# Patient Record
Sex: Female | Born: 2000 | Hispanic: Yes | Marital: Single | Smoking: Never smoker
Health system: Southern US, Community
[De-identification: ages and names within clinical notes are randomized; demographics above are authoritative.]

## PROBLEM LIST (undated history)

## (undated) ENCOUNTER — Inpatient Hospital Stay (HOSPITAL_COMMUNITY): Payer: Self-pay

## (undated) DIAGNOSIS — F32A Depression, unspecified: Secondary | ICD-10-CM

## (undated) DIAGNOSIS — Z3490 Encounter for supervision of normal pregnancy, unspecified, unspecified trimester: Secondary | ICD-10-CM

## (undated) DIAGNOSIS — O139 Gestational [pregnancy-induced] hypertension without significant proteinuria, unspecified trimester: Secondary | ICD-10-CM

## (undated) DIAGNOSIS — E559 Vitamin D deficiency, unspecified: Secondary | ICD-10-CM

## (undated) DIAGNOSIS — Z789 Other specified health status: Secondary | ICD-10-CM

## (undated) DIAGNOSIS — N390 Urinary tract infection, site not specified: Secondary | ICD-10-CM

## (undated) DIAGNOSIS — F419 Anxiety disorder, unspecified: Secondary | ICD-10-CM

## (undated) HISTORY — DX: Depression, unspecified: F32.A

## (undated) HISTORY — PX: WISDOM TOOTH EXTRACTION: SHX21

## (undated) HISTORY — DX: Urinary tract infection, site not specified: N39.0

## (undated) HISTORY — DX: Vitamin D deficiency, unspecified: E55.9

## (undated) HISTORY — PX: NO PAST SURGERIES: SHX2092

## (undated) HISTORY — DX: Gestational (pregnancy-induced) hypertension without significant proteinuria, unspecified trimester: O13.9

## (undated) HISTORY — DX: Anxiety disorder, unspecified: F41.9

---
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
## 2020-01-17 ENCOUNTER — Other Ambulatory Visit: Payer: Self-pay

## 2020-01-17 ENCOUNTER — Emergency Department (HOSPITAL_COMMUNITY)
Admission: EM | Admit: 2020-01-17 | Discharge: 2020-01-17 | Disposition: A | Discharge status: Home / Self Care | Payer: Medicaid Other | Attending: Emergency Medicine | Admitting: Emergency Medicine

## 2020-01-17 ENCOUNTER — Encounter (HOSPITAL_COMMUNITY): Payer: Self-pay

## 2020-01-17 DIAGNOSIS — J029 Acute pharyngitis, unspecified: Secondary | ICD-10-CM | POA: Diagnosis not present

## 2020-01-17 DIAGNOSIS — Z5321 Procedure and treatment not carried out due to patient leaving prior to being seen by health care provider: Secondary | ICD-10-CM | POA: Diagnosis not present

## 2020-01-17 LAB — GROUP A STREP BY PCR: Group A Strep by PCR: NOT DETECTED

## 2020-01-17 NOTE — ED Triage Notes (Signed)
Patient c/o sore throat x 1 week. patient states she noticed white patches today. Patient denies any fever.

## 2021-07-26 ENCOUNTER — Emergency Department (HOSPITAL_BASED_OUTPATIENT_CLINIC_OR_DEPARTMENT_OTHER)
Admission: EM | Admit: 2021-07-26 | Discharge: 2021-07-27 | Disposition: A | Discharge status: Home / Self Care | Payer: Medicaid Other | Attending: Emergency Medicine | Admitting: Emergency Medicine

## 2021-07-26 ENCOUNTER — Encounter (HOSPITAL_BASED_OUTPATIENT_CLINIC_OR_DEPARTMENT_OTHER): Payer: Self-pay

## 2021-07-26 ENCOUNTER — Other Ambulatory Visit: Payer: Self-pay

## 2021-07-26 DIAGNOSIS — S00451A Superficial foreign body of right ear, initial encounter: Secondary | ICD-10-CM | POA: Diagnosis present

## 2021-07-26 DIAGNOSIS — W4904XA Ring or other jewelry causing external constriction, initial encounter: Secondary | ICD-10-CM | POA: Diagnosis not present

## 2021-07-26 NOTE — ED Triage Notes (Signed)
Pt reports ?back of earring lodged in left upper ear-NAD-steady gait

## 2021-07-26 NOTE — ED Provider Notes (Signed)
[facility] Provider Note: Lowella Dell, MD, FACEP  CSN: [id number] MRN: [id number] ARRIVAL: 07/26/21 at 2256 ROOM: [facility]02/[facility]02   CHIEF COMPLAINT  Foreign Body in Ear   HISTORY OF PRESENT ILLNESS  07/26/21 11:57 PM Heather Ingram is a 20 y.o. female who has had a piercing of her right helix for the past year.  Over the past 2 days the site has become painful and erythematous.  She believes the backing of the earring has been withdrawn into the tissue of the ear as she cannot see it or palpated externally.  She rates associated pain as moderate when palpating but minimal at rest.  There has been no purulent drainage.   History reviewed. No pertinent past medical history.  History reviewed. No pertinent surgical history.  Family History  Problem Relation Age of Onset   Diabetes Maternal Aunt    Healthy Mother    Healthy Father     Social History   Tobacco Use   Smoking status: Never   Smokeless tobacco: Never  Vaping Use   Vaping Use: Former   Substances: Nicotine  Substance Use Topics   Alcohol use: Never   Drug use: Never    Prior to Admission medications   Medication Sig Start Date End Date Taking? Authorizing Provider  doxycycline (VIBRAMYCIN) 100 MG capsule Take 1 capsule (100 mg total) by mouth 2 (two) times daily. One po bid x 7 days 07/27/21  Yes Demetric Parslow, MD    Allergies Patient has no known allergies.   REVIEW OF SYSTEMS  Negative except as noted here or in the History of Present Illness.   PHYSICAL EXAMINATION  Initial Vital Signs Blood pressure 132/85, pulse (!) 101, temperature 98.4 F (36.9 C), temperature source Oral, resp. rate 18, height 5\' 2"  (1.575 m), weight (!) 137 kg, last menstrual period 07/25/2021, SpO2 97 %.  Examination General: Well-developed, well-nourished female in no acute distress; appearance consistent with age of record HENT: normocephalic; atraumatic; piercing of right helix with erythema and  withdrawal of the earring back into the tissue of the ear:      Eyes: pupils equal, round and reactive to light; extraocular muscles intact Neck: supple Heart: regular rate and rhythm Lungs: clear to auscultation bilaterally Abdomen: soft; nondistended; nontender; bowel sounds present Extremities: No deformity; full range of motion Neurologic: Awake, alert and oriented; motor function intact in all extremities and symmetric; no facial droop Skin: Warm and dry Psychiatric: Normal mood and affect   RESULTS  Summary of this visit's results, reviewed and interpreted by myself:   EKG Interpretation  Date/Time:    Ventricular Rate:    PR Interval:    QRS Duration:   QT Interval:    QTC Calculation:   R Axis:     Text Interpretation:         Laboratory Studies: No results found for this or any previous visit (from the past 24 hour(s)). Imaging Studies: No results found.  ED COURSE and MDM  Nursing notes, initial and subsequent vitals signs, including pulse oximetry, reviewed and interpreted by myself.  Vitals:   07/26/21 2303 07/26/21 2305  BP: 132/85   Pulse: (!) 101   Resp: 18   Temp: 98.4 F (36.9 C)   TempSrc: Oral   SpO2: 97%   Weight:  (!) 137 kg  Height:  5\' 2"  (1.575 m)   Medications  doxycycline (VIBRA-TABS) tablet 100 mg (has no administration in time range)  lidocaine (PF) (XYLOCAINE) 1 %  injection 5 mL (3 mLs Intradermal Given 07/27/21 0000)    Will place patient on doxycycline for possible bacterial infection.  The erythema and tenderness may also be a foreign body reaction which should improve status post removal.  PROCEDURES  .Foreign Body Removal  Date/Time: 07/27/2021 12:23 AM Performed by: Salayah Meares, MD Authorized by: Avonell Lenig, MD  Consent: Verbal consent obtained. Risks and benefits: risks, benefits and alternatives were discussed Consent given by: patient Patient understanding: patient states understanding of the procedure being  performed Patient identity confirmed: verbally with patient Time out: Immediately prior to procedure a "time out" was called to verify the correct patient, procedure, equipment, support staff and site/side marked as required. Body area: ear Location details: right ear Anesthesia: local infiltration  Anesthesia: Local Anesthetic: lidocaine 1% without epinephrine Anesthetic total: 1 mL  Sedation: Patient sedated: no  Patient restrained: no Patient cooperative: yes Localization method: incision with #11 blade. Removal mechanism: unscrewing of post from back using forceps. Complexity: simple 2 objects recovered. Objects recovered: Earring post and back Post-procedure assessment: foreign body removed Patient tolerance: patient tolerated the procedure well with no immediate complications  ED DIAGNOSES     ICD-10-CM   1. Embedded earring of right ear, initial encounter  S00.451A          Brigette Hopfer, Jonny Ruiz, MD 07/27/21 0028

## 2021-07-27 MED ORDER — LIDOCAINE HCL (PF) 1 % IJ SOLN
INTRAMUSCULAR | Status: AC
  Administered 2021-07-27: 3 mL via INTRADERMAL
  Filled 2021-07-27: qty 5

## 2021-07-27 MED ORDER — DOXYCYCLINE HYCLATE 100 MG PO TABS
100.0000 mg | ORAL_TABLET | Freq: Once | ORAL | Status: AC
  Administered 2021-07-27: 100 mg via ORAL
  Filled 2021-07-27: qty 1

## 2021-07-27 MED ORDER — LIDOCAINE HCL (PF) 1 % IJ SOLN: 5.0000 mL | Freq: Once | INTRAMUSCULAR | Status: AC

## 2021-07-27 MED ORDER — DOXYCYCLINE HYCLATE 100 MG PO CAPS
100.0000 mg | ORAL_CAPSULE | Freq: Two times a day (BID) | ORAL | 0 refills | Status: DC
Start: 2021-07-27 — End: 2021-12-15

## 2021-08-23 ENCOUNTER — Encounter (HOSPITAL_BASED_OUTPATIENT_CLINIC_OR_DEPARTMENT_OTHER): Payer: Self-pay

## 2021-08-23 ENCOUNTER — Emergency Department (HOSPITAL_BASED_OUTPATIENT_CLINIC_OR_DEPARTMENT_OTHER)
Admission: EM | Admit: 2021-08-23 | Discharge: 2021-08-23 | Disposition: A | Discharge status: Home / Self Care | Payer: Medicaid Other | Attending: Emergency Medicine | Admitting: Emergency Medicine

## 2021-08-23 ENCOUNTER — Other Ambulatory Visit: Payer: Self-pay

## 2021-08-23 ENCOUNTER — Emergency Department (HOSPITAL_BASED_OUTPATIENT_CLINIC_OR_DEPARTMENT_OTHER): Payer: Medicaid Other

## 2021-08-23 DIAGNOSIS — J111 Influenza due to unidentified influenza virus with other respiratory manifestations: Secondary | ICD-10-CM

## 2021-08-23 DIAGNOSIS — Z20822 Contact with and (suspected) exposure to covid-19: Secondary | ICD-10-CM | POA: Insufficient documentation

## 2021-08-23 DIAGNOSIS — J101 Influenza due to other identified influenza virus with other respiratory manifestations: Secondary | ICD-10-CM | POA: Diagnosis not present

## 2021-08-23 DIAGNOSIS — R509 Fever, unspecified: Secondary | ICD-10-CM | POA: Diagnosis present

## 2021-08-23 LAB — RESP PANEL BY RT-PCR (FLU A&B, COVID) ARPGX2
Influenza A by PCR: POSITIVE — AB
Influenza B by PCR: NEGATIVE
SARS Coronavirus 2 by RT PCR: NEGATIVE

## 2021-08-23 NOTE — ED Provider Notes (Signed)
[facility] EMERGENCY DEPARTMENT Provider Note   CSN: [id number] Arrival date & time: 08/23/21  0759     History Chief Complaint  Patient presents with   Fever   Cough   Nasal Congestion    Heather Ingram is a 20 y.o. female.  20 year old female who presents with complaints of fever up to 101/102 yesterday, cough, congestion, sore throat and body aches for the past 2 days.  She has been taking over-the-counter medication naproxen with some relief of symptoms.  She has had mild nausea with one episode of emesis but has not thrown up since.  Denies any diarrhea.  She is able to tolerate p.o. intake.  She has had 3 COVID shots but has not yet had her annual flu vaccination.  She is not aware of any sick contacts.  The history is provided by the patient.  Fever Associated symptoms: congestion, cough and sore throat   Cough Associated symptoms: fever and sore throat       History reviewed. No pertinent past medical history.  There are no problems to display for this patient.   History reviewed. No pertinent surgical history.   OB History   No obstetric history on file.     Family History  Problem Relation Age of Onset   Diabetes Maternal Aunt    Healthy Mother    Healthy Father     Social History   Tobacco Use   Smoking status: Never   Smokeless tobacco: Never  Vaping Use   Vaping Use: Former   Substances: Nicotine  Substance Use Topics   Alcohol use: Never   Drug use: Never    Home Medications Prior to Admission medications   Medication Sig Start Date End Date Taking? Authorizing Provider  doxycycline (VIBRAMYCIN) 100 MG capsule Take 1 capsule (100 mg total) by mouth 2 (two) times daily. One po bid x 7 days 07/27/21   Molpus, Jonny Ruiz, MD    Allergies    Patient has no known allergies.  Review of Systems   Review of Systems  Constitutional:  Positive for fever.  HENT:  Positive for congestion and sore throat.   Respiratory:  Positive  for cough.   Cardiovascular: Negative.   Gastrointestinal: Negative.   Endocrine: Negative.   Genitourinary: Negative.   Musculoskeletal: Negative.   Skin: Negative.   Allergic/Immunologic: Negative.   Neurological: Negative.   Hematological: Negative.   Psychiatric/Behavioral: Negative.     Physical Exam Updated Vital Signs BP 115/72 (BP Location: Right Arm)   Pulse (!) 109   Temp 99.1 F (37.3 C) (Oral)   Resp 20   LMP 07/25/2021   SpO2 96%   Physical Exam Constitutional:      General: She is not in acute distress.    Appearance: Normal appearance. She is obese. She is not ill-appearing, toxic-appearing or diaphoretic.  HENT:     Head: Normocephalic and atraumatic.  Cardiovascular:     Rate and Rhythm: Normal rate and regular rhythm.     Pulses: Normal pulses.     Heart sounds: Normal heart sounds.  Pulmonary:     Effort: Pulmonary effort is normal.     Breath sounds: Normal breath sounds.  Abdominal:     General: Abdomen is flat. Bowel sounds are normal.     Palpations: Abdomen is soft.  Musculoskeletal:        General: Normal range of motion.  Skin:    General: Skin is warm and dry.  Neurological:  General: No focal deficit present.     Mental Status: She is alert and oriented to person, place, and time. Mental status is at baseline.  Psychiatric:        Mood and Affect: Mood normal.        Behavior: Behavior normal.        Thought Content: Thought content normal.        Judgment: Judgment normal.    ED Results / Procedures / Treatments   Labs (all labs ordered are listed, but only abnormal results are displayed) Labs Reviewed  RESP PANEL BY RT-PCR (FLU A&B, COVID) [id number]    EKG None  Radiology No results found.  Procedures Procedures   Medications Ordered in ED Medications - No data to display  ED Course  I have reviewed the triage vital signs and the nursing notes.  Pertinent labs & imaging results that were available during my care  of the patient were reviewed by me and considered in my medical decision making (see chart for details).    MDM Rules/Calculators/A&P                           20 year old female who presented with complaints of cough shortness of breath body aches sore throat.  She had received 3 COVID shots had not yet received this years flu shot.  During evaluation in the emergency department she was awake alert and oriented vital signs were stable and she was saturating well on room air 97% chest x-ray did not show any concerning findings.  Respiratory panel was positive for influenza A. Patient was outside window for appropriate administration of Tamiflu.  She is able to tolerate p.o. intake and was instructed to continue supportive care at home.  Strict return precautions were discussed with her.  Final Clinical Impression(s) / ED Diagnoses Final diagnoses:  None    Rx / DC Orders ED Discharge Orders     None        Adron Bene, MD 08/24/21 0900    Tegeler, Canary Brim, MD 08/24/21 [id number]

## 2021-08-23 NOTE — ED Triage Notes (Signed)
Pt c/o fever, cough, congestion, sore throat, and bodyaches x 2 days. Pain score 4/10.  Pt reports taking OTC medication w/ some relief.

## 2021-08-23 NOTE — Discharge Instructions (Addendum)
Dear Ms. Heather Ingram, Today we evaluated you for your cough sore throat body aches.  We tested you for COVID and flu.  COVID test was negative.  Your flu test was positive.  We recommend symptomatic and supportive treatment.  This includes making sure you are drinking adequate fluids.  Tamiflu is only effective within the first 48 hours so you are not likely to receive much benefit from this medication.  If your symptoms do not improve or if they worsen I recommend you return to the emergency department.

## 2021-10-21 NOTE — L&D Delivery Note (Signed)
Delivery Note At 5:24 PM a viable female was delivered via Vaginal, Spontaneous (Presentation: Left Occiput Anterior).  APGAR: 8, 9; weight  pending.   Placenta status: Spontaneous, Intact.  Cord: 3 vessels with the following complications: None.  Cord pH: n/a  Anesthesia: Epidural Episiotomy: None Lacerations: 1st degree;Vaginal Suture Repair: 2.0 3.0 vicryl Est. Blood Loss (mL): 234  Mom to postpartum.  Baby to Couplet care / Skin to Skin.  Delice Lesch 07/24/2022, 5:49 PM

## 2021-12-13 ENCOUNTER — Telehealth: Payer: Self-pay | Admitting: *Deleted

## 2021-12-13 NOTE — Telephone Encounter (Signed)
TC from pt regarding bleeding in early pregnancy. Pt reports approx [redacted] wks pregnant. Has not been seen in the office yet. Advised to seek care in [facility] for vaginal bleeding and to rule out SAB and ectopic pregnancy.

## 2021-12-14 ENCOUNTER — Inpatient Hospital Stay (HOSPITAL_COMMUNITY): Payer: Medicaid Other

## 2021-12-14 ENCOUNTER — Inpatient Hospital Stay (HOSPITAL_COMMUNITY)
Admission: AD | Admit: 2021-12-14 | Discharge: 2021-12-15 | Disposition: A | Discharge status: Home / Self Care | Payer: Medicaid Other | Attending: Obstetrics and Gynecology | Admitting: Obstetrics and Gynecology

## 2021-12-14 ENCOUNTER — Encounter (HOSPITAL_COMMUNITY): Payer: Self-pay | Admitting: Obstetrics and Gynecology

## 2021-12-14 ENCOUNTER — Other Ambulatory Visit: Payer: Self-pay

## 2021-12-14 DIAGNOSIS — Z3A01 Less than 8 weeks gestation of pregnancy: Secondary | ICD-10-CM | POA: Insufficient documentation

## 2021-12-14 DIAGNOSIS — F32A Depression, unspecified: Secondary | ICD-10-CM | POA: Insufficient documentation

## 2021-12-14 DIAGNOSIS — O99341 Other mental disorders complicating pregnancy, first trimester: Secondary | ICD-10-CM | POA: Insufficient documentation

## 2021-12-14 DIAGNOSIS — O26891 Other specified pregnancy related conditions, first trimester: Secondary | ICD-10-CM | POA: Diagnosis not present

## 2021-12-14 DIAGNOSIS — R079 Chest pain, unspecified: Secondary | ICD-10-CM | POA: Diagnosis not present

## 2021-12-14 DIAGNOSIS — O209 Hemorrhage in early pregnancy, unspecified: Secondary | ICD-10-CM | POA: Insufficient documentation

## 2021-12-14 DIAGNOSIS — R109 Unspecified abdominal pain: Secondary | ICD-10-CM | POA: Diagnosis not present

## 2021-12-14 DIAGNOSIS — F419 Anxiety disorder, unspecified: Secondary | ICD-10-CM | POA: Insufficient documentation

## 2021-12-14 HISTORY — DX: Other specified health status: Z78.9

## 2021-12-14 LAB — COMPREHENSIVE METABOLIC PANEL
ALT: 26 U/L (ref 0–44)
AST: 22 U/L (ref 15–41)
Albumin: 3.4 g/dL — ABNORMAL LOW (ref 3.5–5.0)
Alkaline Phosphatase: 44 U/L (ref 38–126)
Anion gap: 9 (ref 5–15)
BUN: 9 mg/dL (ref 6–20)
CO2: 22 mmol/L (ref 22–32)
Calcium: 9 mg/dL (ref 8.9–10.3)
Chloride: 102 mmol/L (ref 98–111)
Creatinine, Ser: 0.69 mg/dL (ref 0.44–1.00)
GFR, Estimated: >60 mL/min (ref >60)
Glucose, Bld: 117 mg/dL — ABNORMAL HIGH (ref 70–99)
Potassium: 3.9 mmol/L (ref 3.5–5.1)
Sodium: 133 mmol/L — ABNORMAL LOW (ref 135–145)
Total Bilirubin: 0.2 mg/dL — ABNORMAL LOW (ref 0.3–1.2)
Total Protein: 6.6 g/dL (ref 6.5–8.1)

## 2021-12-14 LAB — CBC
HCT: 35.9 % — ABNORMAL LOW (ref 36.0–46.0)
Hemoglobin: 12.2 g/dL (ref 12.0–15.0)
MCH: 28.1 pg (ref 26.0–34.0)
MCHC: 34 g/dL (ref 30.0–36.0)
MCV: 82.7 fL (ref 80.0–100.0)
Platelets: 301 10*3/uL (ref 150–400)
RBC: 4.34 MIL/uL (ref 3.87–5.11)
RDW: 12.5 % (ref 11.5–15.5)
WBC: 10.3 10*3/uL (ref 4.0–10.5)
nRBC: 0 % (ref 0.0–0.2)

## 2021-12-14 LAB — TROPONIN I (HIGH SENSITIVITY): Troponin I (High Sensitivity): <2 ng/L (ref <18)

## 2021-12-14 LAB — WET PREP, GENITAL
Clue Cells Wet Prep HPF POC: NONE SEEN
Sperm: NONE SEEN
Trich, Wet Prep: NONE SEEN
WBC, Wet Prep HPF POC: >=10 — AB (ref <10)
Yeast Wet Prep HPF POC: NONE SEEN

## 2021-12-14 LAB — POCT PREGNANCY, URINE: Preg Test, Ur: POSITIVE — AB

## 2021-12-14 LAB — ABO/RH: ABO/RH(D): B POS

## 2021-12-14 LAB — HCG, QUANTITATIVE, PREGNANCY: hCG, Beta Chain, Quant, S: 76603 m[IU]/mL — ABNORMAL HIGH (ref <5)

## 2021-12-14 LAB — BRAIN NATRIURETIC PEPTIDE: B Natriuretic Peptide: 16.2 pg/mL (ref 0.0–100.0)

## 2021-12-14 NOTE — MAU Note (Signed)
Pt reports to [facility]U with c/o vaginal bleeding that started on Tuesday only has been blood show on tissue.  Was advised to come in since it had been over several days.  No abnormal discharge.  Pt reports that she is having chest pain that started today.  Pt states that it is a sharp shooting pain that last for about an hour today.  Pt states that she was provided with an inhaler in January but has not used it.  Pt reports LMP 10/25/2021.

## 2021-12-14 NOTE — MAU Provider Note (Signed)
Chief Complaint: Vaginal Bleeding   Event Date/Time   First Provider Initiated Contact with Patient 12/14/21 2236      SUBJECTIVE HPI: Heather Ingram is a 21 y.o. G1P0 at [redacted]w[redacted]d by sure LMP who presents to maternity admissions reporting onset of bleeding 3 days ago, that was heavy enough to require a pantyliner when it started and light red but is now brown and spotting only. There is mild abdominal cramping. Today, she had a sharp chest pain that she often has when she is on her period so she wanted to get checked out. The chest pain is now dull/pressure and mild but is still present. There is no SOB.     HPI  Past Medical History:  Diagnosis Date   Medical history non-contributory    Past Surgical History:  Procedure Laterality Date   NO PAST SURGERIES     Social History   Socioeconomic History   Marital status: Single    Spouse name: Not on file   Number of children: Not on file   Years of education: Not on file   Highest education level: Not on file  Occupational History   Not on file  Tobacco Use   Smoking status: Never   Smokeless tobacco: Never  Vaping Use   Vaping Use: Former   Substances: Nicotine  Substance and Sexual Activity   Alcohol use: Never   Drug use: Never   Sexual activity: Yes    Birth control/protection: None  Other Topics Concern   Not on file  Social History Narrative   Not on file   Social Determinants of Health   Financial Resource Strain: Not on file  Food Insecurity: Not on file  Transportation Needs: Not on file  Physical Activity: Not on file  Stress: Not on file  Social Connections: Not on file  Intimate Partner Violence: Not on file   No current facility-administered medications on file prior to encounter.   Current Outpatient Medications on File Prior to Encounter  Medication Sig Dispense Refill   buPROPion (WELLBUTRIN XL) 150 MG 24 hr tablet Take 150 mg by mouth daily.     No Known Allergies  ROS:  Review of  Systems  Constitutional:  Negative for chills, fatigue and fever.  Respiratory:  Negative for shortness of breath.   Cardiovascular:  Positive for chest pain.  Gastrointestinal:  Positive for abdominal pain. Negative for constipation, diarrhea and vomiting.  Genitourinary:  Positive for vaginal bleeding. Negative for difficulty urinating, dysuria, flank pain, pelvic pain, vaginal discharge and vaginal pain.  Neurological:  Negative for dizziness and headaches.  Psychiatric/Behavioral: Negative.      I have reviewed patient's Past Medical Hx, Surgical Hx, Family Hx, Social Hx, medications and allergies.   Physical Exam  Patient Vitals for the past 24 hrs:  BP Temp Temp src Pulse Resp SpO2 Weight  12/15/21 0009 138/82 -- -- 89 18 -- --  12/14/21 2222 117/80 -- -- 97 16 100 % --  12/14/21 2209 (!) 119/51 98.1 F (36.7 C) Oral (!) 105 16 99 % (!) 140.4 kg   Constitutional: Well-developed, well-nourished female in no acute distress.  HEART: normal rate, heart sounds, regular rhythm RESP: normal effort, lung sounds clear and equal bilaterally  GI: Abd soft, non-tender. Pos BS x 4 MS: Extremities nontender, no edema, normal ROM Neurologic: Alert and oriented x 4.  GU: Neg CVAT.  PELVIC EXAM: Self swab performed by pt for vaginal cultures    LAB RESULTS Results for  orders placed or performed during the hospital encounter of 12/14/21 (from the past 24 hour(s))  Pregnancy, urine POC     Status: Abnormal   Collection Time: 12/14/21 10:05 PM  Result Value Ref Range   Preg Test, Ur POSITIVE (A) NEGATIVE  Wet prep, genital     Status: Abnormal   Collection Time: 12/14/21 10:40 PM  Result Value Ref Range   Yeast Wet Prep HPF POC NONE SEEN NONE SEEN   Trich, Wet Prep NONE SEEN NONE SEEN   Clue Cells Wet Prep HPF POC NONE SEEN NONE SEEN   WBC, Wet Prep HPF POC >=10 (A) <10   Sperm NONE SEEN   CBC     Status: Abnormal   Collection Time: 12/14/21 10:53 PM  Result Value Ref Range   WBC  10.3 4.0 - 10.5 K/uL   RBC 4.34 3.87 - 5.11 MIL/uL   Hemoglobin 12.2 12.0 - 15.0 g/dL   HCT 37.9 (L) 02.4 - 09.7 %   MCV 82.7 80.0 - 100.0 fL   MCH 28.1 26.0 - 34.0 pg   MCHC 34.0 30.0 - 36.0 g/dL   RDW 35.3 29.9 - 24.2 %   Platelets 301 150 - 400 K/uL   nRBC 0.0 0.0 - 0.2 %  hCG, quantitative, pregnancy     Status: Abnormal   Collection Time: 12/14/21 10:53 PM  Result Value Ref Range   hCG, Beta Chain, Quant, S 76,603 (H) <5 mIU/mL  ABO/Rh     Status: None   Collection Time: 12/14/21 10:53 PM  Result Value Ref Range   ABO/RH(D) B POS    No rh immune globuloin      NOT A RH IMMUNE GLOBULIN CANDIDATE, PT RH POSITIVE Performed at [facility], [address], [place], [place] [address]   Comprehensive metabolic panel     Status: Abnormal   Collection Time: 12/14/21 10:53 PM  Result Value Ref Range   Sodium 133 (L) 135 - 145 mmol/L   Potassium 3.9 3.5 - 5.1 mmol/L   Chloride 102 98 - 111 mmol/L   CO2 22 22 - 32 mmol/L   Glucose, Bld 117 (H) 70 - 99 mg/dL   BUN 9 6 - 20 mg/dL   Creatinine, Ser 9.62 0.44 - 1.00 mg/dL   Calcium 9.0 8.9 - 22.9 mg/dL   Total Protein 6.6 6.5 - 8.1 g/dL   Albumin 3.4 (L) 3.5 - 5.0 g/dL   AST 22 15 - 41 U/L   ALT 26 0 - 44 U/L   Alkaline Phosphatase 44 38 - 126 U/L   Total Bilirubin 0.2 (L) 0.3 - 1.2 mg/dL   GFR, Estimated >79 >89 mL/min   Anion gap 9 5 - 15  Troponin I (High Sensitivity)     Status: None   Collection Time: 12/14/21 10:53 PM  Result Value Ref Range   Troponin I (High Sensitivity) <2 <18 ng/L  Brain natriuretic peptide     Status: None   Collection Time: 12/14/21 10:53 PM  Result Value Ref Range   B Natriuretic Peptide 16.2 0.0 - 100.0 pg/mL    --/--/B POS (02/24 2253)  EKG: normal EKG, normal sinus rhythm.  IMAGING US OB LESS THAN 14 WEEKS WITH OB TRANSVAGINAL  Result Date: 12/14/2021 CLINICAL DATA:  Bleeding EXAM: OBSTETRIC <14 WK [place] AND TRANSVAGINAL OB US TECHNIQUE: Both transabdominal and transvaginal  ultrasound examinations were performed for complete evaluation of the gestation as well as the maternal uterus, adnexal regions, and pelvic  cul-de-sac. Transvaginal technique was performed to assess early pregnancy. COMPARISON:  None. FINDINGS: Intrauterine gestational sac: Single Yolk sac:  Visualized. Embryo:  Visualized. Cardiac Activity: Visualized. Heart Rate: 135 bpm MSD:   mm    w     d CRL:  11.6 mm   7 w   2 d                  [place] EDC: 07/31/2022 Subchorionic hemorrhage:  None visualized. Maternal uterus/adnexae: No adnexal mass or free fluid. IMPRESSION: Seven week 2 day intrauterine pregnancy. Fetal heart rate 135 beats per minute. No acute maternal findings. Electronically Signed   By: Charlett Nose M.D.   On: 12/14/2021 23:30    MAU Management/MDM: Orders Placed This Encounter  Procedures   Wet prep, genital   US OB LESS THAN 14 WEEKS WITH OB TRANSVAGINAL   CBC   hCG, quantitative, pregnancy   Comprehensive metabolic panel   Brain natriuretic peptide   Pregnancy, urine POC   ED EKG   ABO/Rh   Discharge patient    Meds ordered this encounter  Medications   hydrOXYzine (VISTARIL) 25 MG capsule    Sig: Take 1 capsule (25 mg total) by mouth 3 (three) times daily as needed.    Dispense:  30 capsule    Refill:  5    Order Specific Question:   Supervising Provider    Answer:   Alysia Penna, MICHAEL L [[id number]]    Normal Heart/lung sounds, normal heartrate, cardiac labs, and EKG normal, so no cardiopulmonary causes for chest pain found.  Pt has hx of this pain with menses, so possible referred pain. Pt also recently dx and started treatment for anxiety/depression so this could contribute to chest tightness/pain.  Warning signs/reasons to seek care reviewed.    IUP with normal fetal heartrate noted on [place] today.  Discussed results with pt. No causes for bleeding found. GCC pending.    Reviewed pt at home meds.  If pt can do trial without Vyvanse, I recommend this but can resume and discuss at her  prenatal visits if she does not do well.  I discussed Wellbutrin and Trazodone as medications that are acceptable but with more recommended alternatives in pregnancy.  Pt just started Wellbutrin 1 month ago.  Benefits of continuing medication likely outweigh any small risks.  Rx for Vistaril today for anxiety, pt to limit Trazodone use.  F/U at [facility] as scheduled, return to MAU as needed for emergencies.    ASSESSMENT 1. [redacted] weeks gestation of pregnancy   2. Vaginal bleeding in pregnancy, first trimester   3. Anxiety during pregnancy, antepartum, first trimester     PLAN Discharge home Allergies as of 12/15/2021   No Known Allergies      Medication List     STOP taking these medications    doxycycline 100 MG capsule Commonly known as: VIBRAMYCIN   lisdexamfetamine 30 MG capsule Commonly known as: VYVANSE   traZODone 50 MG tablet Commonly known as: DESYREL       TAKE these medications    buPROPion 150 MG 24 hr tablet Commonly known as: WELLBUTRIN XL Take 150 mg by mouth daily.   hydrOXYzine 25 MG capsule Commonly known as: Vistaril Take 1 capsule (25 mg total) by mouth 3 (three) times daily as needed.        Follow-up Information     [facility] Follow up.   Why: As scheduled Contact information: [address]  [place] [phone]        [facility] Follow up.   Specialty: Obstetrics and Gynecology Why: As needed for emergencies Contact information: [address] [id number] [place] [phone]                Sharen CounterLisa Leftwich-Kirby Certified Nurse-Midwife 12/15/2021  12:17 AM

## 2021-12-15 MED ORDER — HYDROXYZINE PAMOATE 25 MG PO CAPS: 25.0000 mg | ORAL_CAPSULE | Freq: Three times a day (TID) | ORAL | 5 refills | Status: DC | PRN

## 2021-12-15 NOTE — MAU Note (Signed)
Patient signed physical copy of AVS due to E-signature malfunction in room.  

## 2021-12-17 LAB — GC/CHLAMYDIA PROBE AMP (~~LOC~~) NOT AT ARMC
Chlamydia: NEGATIVE
Comment: NEGATIVE
Comment: NORMAL
Neisseria Gonorrhea: NEGATIVE

## 2021-12-28 ENCOUNTER — Ambulatory Visit (INDEPENDENT_AMBULATORY_CARE_PROVIDER_SITE_OTHER): Payer: Medicaid Other

## 2021-12-28 VITALS — Ht 63.0 in

## 2021-12-28 DIAGNOSIS — Z3400 Encounter for supervision of normal first pregnancy, unspecified trimester: Secondary | ICD-10-CM | POA: Insufficient documentation

## 2021-12-28 DIAGNOSIS — O2341 Unspecified infection of urinary tract in pregnancy, first trimester: Secondary | ICD-10-CM

## 2021-12-28 HISTORY — DX: Encounter for supervision of normal first pregnancy, unspecified trimester: Z34.00

## 2021-12-28 MED ORDER — BLOOD PRESSURE KIT DEVI: 1.0000 | 0 refills | Status: DC | PRN

## 2021-12-28 MED ORDER — GOJJI WEIGHT SCALE MISC: 1.0000 | 0 refills | Status: DC | PRN

## 2021-12-28 NOTE — Progress Notes (Addendum)
..  New OB Intake ? ?I connected with  Heather Ingram on 12/28/21 at  9:00 AM EST by telephone Video Visit and verified that I am speaking with the correct person using two identifiers. Nurse is located at [facility] and pt is located at home. ? ?I discussed the limitations, risks, security and privacy concerns of performing an evaluation and management service by telephone and the availability of in person appointments. I also discussed with the patient that there may be a patient responsible charge related to this service. The patient expressed understanding and agreed to proceed. ? ?I explained I am completing New OB Intake today. We discussed her EDD of 08/01/22 that is based on LMP of 10/25/21. Pt is G1/P0. I reviewed her allergies, medications, Medical/Surgical/OB history, and appropriate screenings. I informed her of [facility] services. Based on history, this is a/an  pregnancy uncomplicated .  ? ?Pt is reporting discomfort and odor with urination. Advised pt to come to office and leave urine sample for testing today, pt agreed. ?Pt scored 17 on GAD7 and 11 on PHQ9, referral placed ? ?Concerns addressed today ? ?Delivery Plans:  ?Plans to deliver at [facility].  ? ?Waterbirth candidate?  ? ?MyChart/Babyscripts ?MyChart access verified. I explained pt will have some visits in office and some virtually. Babyscripts instructions given and order placed. Patient verifies receipt of registration text/e-mail. Account successfully created and app downloaded. ? ?Blood Pressure Cuff  ?Blood pressure cuff ordered for patient to pick-up from [employer]. Explained after first prenatal appt pt will check weekly and document in Babyscripts. ? ?Weight scale: Patient does / does not  have weight scale. Weight scale ordered for patient to pick up from [employer].  ? ?Anatomy US ?Explained first scheduled [place] will be around 19 weeks. Anatomy US pending to be scheduled. ?Scheduled AFP lab only appointment if  CenteringPregnancy pt for same day as anatomy US.  ? ?Labs ?Discussed Avelina Laine genetic screening with patient. Would like both Panorama and Horizon drawn at new OB visit.Also if interested in genetic testing, tell patient she will need AFP 15-21 weeks to complete genetic testing .Routine prenatal labs needed. ? ?Covid Vaccine ?Patient has covid vaccine.  ? ?Is patient a CenteringPregnancy candidate? Not a candidate  ? ?Is patient a Mom+Baby Combined Care candidate? Not a candidate    ? ?Informed patient of [facility] website  and placed link in her AVS.  ? ?Social Determinants of Health ?Food Insecurity: Patient denies food insecurity. ?WIC Referral: Patient is not interested in referral to [facility].  ?Transportation: Patient denies transportation needs. ?Childcare: Discussed no children allowed at ultrasound appointments. Offered childcare services; patient declines childcare services at this time. ? ?Send link to Pregnancy Navigators ? ? ?Placed OB Box on problem list and updated ? ?First visit review ?I reviewed new OB appt with pt. I explained she will have a pelvic exam, ob bloodwork with genetic screening, and PAP smear. Explained pt will be seen by [facility] at first visit; encounter routed to appropriate provider. Explained that patient will be seen by pregnancy navigator following visit with provider. [facility] information placed in AVS.  ? ?Katrina Stack, RN ?12/28/2021  10:35 AM  ? ?Patient was assessed and managed by nursing staff during this encounter. I have reviewed the chart and agree with the documentation and plan. I have also made any necessary editorial changes. ? ?Jaynie Collins, MD ?12/28/2021 11:30 AM  ? ?

## 2021-12-31 ENCOUNTER — Encounter: Payer: Self-pay | Admitting: Obstetrics & Gynecology

## 2021-12-31 DIAGNOSIS — O2341 Unspecified infection of urinary tract in pregnancy, first trimester: Secondary | ICD-10-CM | POA: Insufficient documentation

## 2021-12-31 LAB — URINE CULTURE, OB REFLEX

## 2021-12-31 LAB — CULTURE, OB URINE

## 2021-12-31 MED ORDER — CEFADROXIL 500 MG PO CAPS: 500.0000 mg | ORAL_CAPSULE | Freq: Two times a day (BID) | ORAL | 0 refills | Status: DC

## 2021-12-31 NOTE — Addendum Note (Signed)
Addended by: Jaynie Collins A on: 12/31/2021 11:06 AM ? ? Modules accepted: Orders ? ?

## 2022-01-04 ENCOUNTER — Ambulatory Visit (INDEPENDENT_AMBULATORY_CARE_PROVIDER_SITE_OTHER): Payer: Medicaid Other

## 2022-01-04 ENCOUNTER — Other Ambulatory Visit: Payer: Self-pay

## 2022-01-04 ENCOUNTER — Ambulatory Visit (INDEPENDENT_AMBULATORY_CARE_PROVIDER_SITE_OTHER): Payer: Medicaid Other | Admitting: Licensed Clinical Social Worker

## 2022-01-04 ENCOUNTER — Other Ambulatory Visit (HOSPITAL_COMMUNITY)
Admission: RE | Admit: 2022-01-04 | Discharge: 2022-01-04 | Disposition: A | Discharge status: Home / Self Care | Payer: Medicaid Other | Source: Ambulatory Visit

## 2022-01-04 VITALS — BP 106/72 | HR 94 | Wt 308.0 lb

## 2022-01-04 DIAGNOSIS — Z3A10 10 weeks gestation of pregnancy: Secondary | ICD-10-CM

## 2022-01-04 DIAGNOSIS — Z8659 Personal history of other mental and behavioral disorders: Secondary | ICD-10-CM

## 2022-01-04 DIAGNOSIS — Z1331 Encounter for screening for depression: Secondary | ICD-10-CM

## 2022-01-04 DIAGNOSIS — O99210 Obesity complicating pregnancy, unspecified trimester: Secondary | ICD-10-CM

## 2022-01-04 DIAGNOSIS — Z3400 Encounter for supervision of normal first pregnancy, unspecified trimester: Secondary | ICD-10-CM | POA: Diagnosis present

## 2022-01-04 DIAGNOSIS — O2341 Unspecified infection of urinary tract in pregnancy, first trimester: Secondary | ICD-10-CM

## 2022-01-04 NOTE — Progress Notes (Signed)
?  ? ?Subjective:  ? ?Heather Ingram is a 21 y.o. G1P0 at [redacted]w[redacted]d by Definite LMP of Oct 25, 2021 being seen today for her first obstetrical visit.  Patient states this was an planned pregnancy.  Patient reports she was on birth control prior to conception, Nexplanon.  She reports using no other birth control in the past.   ? ?Gynecological/Obstetrical History: ?Patient reports no history of gynecological surgeries.  Patient no history of abnormal pap smears.  ? ?Pregnancy history fully reviewed. Patient does intend to breast feed. Patient obstetrical history is significant for obesity.  ? ?Sexual Activity and Vaginal Concerns: Patient is currently sexually active and denies pain or discomfort during intercourse.  She also denies vaginal discharge, bleeding, irritation, or odor. Patient also denies pain or difficulty with urination.  ? ? ?Medical History/ROS: Patient denies medical history significant for cardiovascular, respiratory, gastrointestinal, or hematological disorders. Patient  endorses a history of anxiety and depression, while reporting she received a prescription, but did not start due to pregnancy.  ?Patient reports nausea.  Patient denies constipation/diarrhea or nausea/vomiting.  No recurrent headaches.  ? ? ?Social History: ?Patient reports history of vape usage prior to pregnancy.  She denies current usage of tobacco, alcohol, or drugs.  Patient reports the FOB is Tressie Ellis who is involved, supportive, and present today.  Patient reports that she lives with father and brothers and endorses safety at home.  Patient denies DV/A. 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 ? ?HISTORY: ?OB History  ?Gravida Para Term Preterm AB Living  ?1 0 0 0 0 0  ?SAB IAB Ectopic Multiple Live Births  ?0 0 0 0 0  ?  ?# Outcome Date GA Lbr Len/2nd Weight Sex Delivery Anes PTL Lv  ?1 Current           ?  ?Last pap smear was done today and is pending.  ? ?Past Medical History:   ?Diagnosis Date  ? Medical history non-contributory   ? ?Past Surgical History:  ?Procedure Laterality Date  ? NO PAST SURGERIES    ? ?Family History  ?Problem Relation Age of Onset  ? Diabetes Maternal Aunt   ? Healthy Mother   ? Healthy Father   ? ?Social History  ? ?Tobacco Use  ? Smoking status: Never  ? Smokeless tobacco: Never  ?Vaping Use  ? Vaping Use: Former  ? Start date: 10/21/2018  ? Quit date: 12/09/2021  ? Substances: Nicotine  ?Substance Use Topics  ? Alcohol use: Never  ? Drug use: Never  ? ?No Known Allergies ?Current Outpatient Medications on File Prior to Visit  ?Medication Sig Dispense Refill  ? Blood Pressure Monitoring (BLOOD PRESSURE KIT) DEVI 1 kit by Does not apply route as needed. Large cuff 1 each 0  ? buPROPion (WELLBUTRIN XL) 150 MG 24 hr tablet Take 150 mg by mouth daily.    ? cefadroxil (DURICEF) 500 MG capsule Take 1 capsule (500 mg total) by mouth 2 (two) times daily. 14 capsule 0  ? hydrOXYzine (VISTARIL) 25 MG capsule Take 1 capsule (25 mg total) by mouth 3 (three) times daily as needed. 30 capsule 5  ? Misc. Devices (GOJJI WEIGHT SCALE) MISC 1 Device by Does not apply route as needed. 1 each 0  ? ?No current facility-administered medications on file prior to visit.  ? ? ?Review of Systems ?Pertinent items noted in HPI and remainder of comprehensive ROS otherwise negative. ? ?Exam  ? ?  Vitals:  ? 01/04/22 0930  ?BP: 106/72  ?Pulse: 94  ?Weight: (!) 308 lb (139.7 kg)  ? ?  ? ?Physical Exam ?Constitutional:   ?   General: She is not in acute distress. ?   Appearance: She is obese.  ?Genitourinary:  ?   Genitourinary Comments: Vaginal vault with small amt milky white mucoid discharge. ?Cervix not visualized d/t position.  Pap collected with brush, broom, and spatula.  Friable.   ?   Vaginal discharge present.  ?   No vaginal bleeding.  ?HENT:  ?   Head: Atraumatic.  ?Eyes:  ?   Conjunctiva/sclera: Conjunctivae normal.  ?Cardiovascular:  ?   Rate and Rhythm: Normal rate and regular  rhythm.  ?   Heart sounds: Normal heart sounds.  ?Pulmonary:  ?   Effort: Pulmonary effort is normal.  ?   Breath sounds: Normal breath sounds.  ?Musculoskeletal:  ?   Cervical back: Normal range of motion.  ?Neurological:  ?   Mental Status: She is alert and oriented to person, place, and time.  ?Skin: ?   General: Skin is warm and dry.  ?Psychiatric:     ?   Mood and Affect: Mood normal.     ?   Behavior: Behavior normal.     ?   Thought Content: Thought content normal.  ?Vitals reviewed. Exam conducted with a chaperone present.  ? ? ?Assessment:  ? ?21 y.o. year old G1P0 ?Patient Active Problem List  ? Diagnosis Date Noted  ? Maternal morbid obesity, antepartum ([place]) 01/05/2022  ? UTI (urinary tract infection) in pregnancy in first trimester 12/31/2021  ? Supervision of normal first pregnancy, antepartum 12/28/2021  ? ?  ?Plan:  ?1. Supervision of normal first pregnancy, antepartum ?-Congratulations given and patient welcomed to practice. ?-Reviewed prenatal visit schedule and platforms used for virtual visits.  ?-Anticipatory guidance for prenatal visits including labs, ultrasounds, and testing; Initial labs drawn. ?-Genetic Screening discussed, NIPS:  discussed and to be completed in 2 weeks . ?-Encouraged to complete and utilize MyChart Registration for her ability to review results, send requests, and have questions addressed.  ?-Discussed estimated due date of Aug 01, 2022. ?-Ultrasound discussed; fetal anatomic survey: ordered. ?-Continue prenatal vitamins  ?-Encouraged to seek out care at office or emergency room for urgent and/or emergent concerns. ?-Educated on the nature of [place] with multiple MDs and other Advanced Practice Providers was explained to patient; also emphasized that residents, students are part of our team. Informed of her right to refuse care as she deems appropriate.  ?-No questions or concerns.  ? ?2. [redacted] weeks gestation of pregnancy ?-Unable to  doppler FHT. ?-Fetal movement noted on BSUS. ?-Informal nurse Korea complete and FHT 176. ? ?3. UTI (urinary tract infection) in pregnancy in first trimester ?-Plan for TOC at next visit.  ? ?4. Maternal morbid obesity, antepartum ([place]) ?-BMI 54 today ?-Plan for Growth [place] starting at 24 weeks ?-Start BPPs at 34 weeks. ?-Plan for delivery between 38-39 weeks.  ?-Rx for bASA sent to pharmacy on file with plan to start after 13 weeks.  ? ?5. History of anxiety and depression ?-Referral to IBH ? ? ?Problem list reviewed and updated. ?Routine obstetric precautions reviewed. ? ?No orders of the defined types were placed in this encounter. ?  ?No follow-ups on file. ? ?  ?Maryann Conners, CNM ?01/04/2022 9:51 AM ?

## 2022-01-05 DIAGNOSIS — Z8659 Personal history of other mental and behavioral disorders: Secondary | ICD-10-CM | POA: Insufficient documentation

## 2022-01-05 HISTORY — DX: Obesity complicating pregnancy, unspecified trimester: E66.01

## 2022-01-05 LAB — CBC/D/PLT+RPR+RH+ABO+RUBIGG...
Antibody Screen: NEGATIVE
Basophils Absolute: 0 10*3/uL (ref 0.0–0.2)
Basos: 1 %
EOS (ABSOLUTE): 0 10*3/uL (ref 0.0–0.4)
Eos: 1 %
HCV Ab: NONREACTIVE
HIV Screen 4th Generation wRfx: NONREACTIVE
Hematocrit: 37 % (ref 34.0–46.6)
Hemoglobin: 12.5 g/dL (ref 11.1–15.9)
Hepatitis B Surface Ag: NEGATIVE
Immature Grans (Abs): 0 10*3/uL (ref 0.0–0.1)
Immature Granulocytes: 0 %
Lymphocytes Absolute: 1.9 10*3/uL (ref 0.7–3.1)
Lymphs: 24 %
MCH: 28.2 pg (ref 26.6–33.0)
MCHC: 33.8 g/dL (ref 31.5–35.7)
MCV: 83 fL (ref 79–97)
Monocytes Absolute: 0.5 10*3/uL (ref 0.1–0.9)
Monocytes: 7 %
Neutrophils Absolute: 5.3 10*3/uL (ref 1.4–7.0)
Neutrophils: 67 %
Platelets: 295 10*3/uL (ref 150–450)
RBC: 4.44 x10E6/uL (ref 3.77–5.28)
RDW: 13.1 % (ref 11.7–15.4)
RPR Ser Ql: NONREACTIVE
Rh Factor: POSITIVE
Rubella Antibodies, IGG: 1.37 index (ref >0.99)
WBC: 7.8 10*3/uL (ref 3.4–10.8)

## 2022-01-05 LAB — HEMOGLOBIN A1C
Est. average glucose Bld gHb Est-mCnc: 100 mg/dL
Hgb A1c MFr Bld: 5.1 % (ref 4.8–5.6)

## 2022-01-05 LAB — HCV INTERPRETATION

## 2022-01-07 LAB — CERVICOVAGINAL ANCILLARY ONLY
Chlamydia: NEGATIVE
Comment: NEGATIVE
Comment: NEGATIVE
Comment: NORMAL
Neisseria Gonorrhea: NEGATIVE
Trichomonas: NEGATIVE

## 2022-01-07 NOTE — BH Specialist Note (Signed)
Integrated Behavioral Health Initial In-Person Visit ? ?MRN: [id number] ?Name: Heather Ingram ? ?Number of Integrated Behavioral Health Clinician visits: 1 ?Session Start time:  10:00am ?Session End time: 10:37am ?Total time in minutes: 37 mins via mychart video  ? ?Types of Service: Individual psychotherapy ? ?Interpretor:No. Interpretor Name and Language: none ? ? Warm Hand Off Completed. ?  ? ?  ? ? ?Subjective: ?Heather Ingram is a 21 y.o. female accompanied by Partner/Significant Other ?Patient was referred by Sabas Sous CNM for positive depression screen. ?Patient reports the following symptoms/concerns: Nervous, feeling overwhelmed, and low mood  ?Duration of problem: approx one month; Severity of problem: mild ? ?Objective: ?Mood: good  and Affect: Appropriate ?Risk of harm to self or others: No plan to harm self or others ? 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 ?Patient and/or Family's Strengths/Protective Factors: ?Concrete supports in place (healthy food, safe environments, etc.) ? ?Goals Addressed: ?Patient will: ?Reduce symptoms of: anxiety and depression ?Increase knowledge and/or ability of: coping skills  ?Demonstrate ability to: Increase healthy adjustment to current life circumstances ? ?Progress towards Goals: ?Ongoing ? ?Interventions: ?Interventions utilized: Supportive Counseling  ?Standardized Assessments completed: PHQ 9 ? ?Patient and/or Family Response: Heather Ingram responded well to visit  ? ?Assessment: ?Heather Ingram has a history of depression. Heather Ingram reports low mood and feeling on edge. Heather Ingram reports father of baby is supportive and denies history of domestic violence.  ?  ?Patient may benefit from integrated behavioral health. ? ?Plan: ?Follow up with behavioral health clinician on : 02/01/2022 ?Behavioral recommendations Mindfulness and  relaxation techniques, listen to music and engage in light physical activity ?Referral(s): Integrated [employer] (In Clinic) ? ? ?Gwyndolyn Saxon, LCSW ? ? ? ? ? ? ? ? ?

## 2022-01-08 ENCOUNTER — Telehealth: Payer: Self-pay

## 2022-01-08 NOTE — Telephone Encounter (Signed)
error 

## 2022-01-10 LAB — CYTOLOGY - PAP
Comment: NEGATIVE
Comment: NEGATIVE
Diagnosis: UNDETERMINED — AB
HPV 16: NEGATIVE
HPV 18 / 45: NEGATIVE
High risk HPV: POSITIVE — AB

## 2022-01-12 DIAGNOSIS — R87620 Atypical squamous cells of undetermined significance on cytologic smear of vagina (ASC-US): Secondary | ICD-10-CM | POA: Insufficient documentation

## 2022-01-18 ENCOUNTER — Other Ambulatory Visit: Payer: Medicaid Other

## 2022-01-18 DIAGNOSIS — Z3400 Encounter for supervision of normal first pregnancy, unspecified trimester: Secondary | ICD-10-CM

## 2022-01-22 ENCOUNTER — Encounter: Payer: Self-pay | Admitting: Obstetrics

## 2022-01-22 ENCOUNTER — Telehealth (INDEPENDENT_AMBULATORY_CARE_PROVIDER_SITE_OTHER): Payer: Medicaid Other | Admitting: Obstetrics

## 2022-01-22 DIAGNOSIS — F419 Anxiety disorder, unspecified: Secondary | ICD-10-CM

## 2022-01-22 DIAGNOSIS — O99341 Other mental disorders complicating pregnancy, first trimester: Secondary | ICD-10-CM

## 2022-01-22 DIAGNOSIS — Z3400 Encounter for supervision of normal first pregnancy, unspecified trimester: Secondary | ICD-10-CM

## 2022-01-22 NOTE — Progress Notes (Signed)
? ?  OBSTETRICS PRENATAL VIRTUAL VISIT ENCOUNTER NOTE ? ?Provider location: [facility] for [employer] at [place]  ? ?Patient location: Home ? ?I connected with Heather Ingram on 01/22/22 at  8:55 AM EDT by MyChart Video Encounter and verified that I am speaking with the correct person using two identifiers. I discussed the limitations, risks, security and privacy concerns of performing an evaluation and management service virtually and the availability of in person appointments. I also discussed with the patient that there may be a patient responsible charge related to this service. The patient expressed understanding and agreed to proceed. ?Subjective:  ?Heather Ingram is a 21 y.o. G1P0 at [redacted]w[redacted]d being seen today for ongoing prenatal care.  She is currently monitored for the following issues for this low-risk pregnancy and has Supervision of normal first pregnancy, antepartum; UTI (urinary tract infection) in pregnancy in first trimester; Maternal morbid obesity, antepartum (HCC); History of depression; History of anxiety; and ASCUS with positive high risk human papillomavirus of vagina on their problem list. ? ?Patient reports a desire to work at home during pregnancy if her job approves.  Contractions: Not present. Vag. Bleeding: None.   . Denies any leaking of fluid.  ? ?The following portions of the patient's history were reviewed and updated as appropriate: allergies, current medications, past family history, past medical history, past social history, past surgical history and problem list.  ? ?Objective:  ?There were no vitals filed for this visit. ? ?Fetal Status:          ? ?General:  Alert, oriented and cooperative. Patient is in no acute distress.  ?Respiratory: Normal respiratory effort, no problems with respiration noted  ?Mental Status: Normal mood and affect. Normal behavior. Normal judgment and thought content.  ?Rest of physical exam deferred due to type of  encounter ? ?Imaging: ?No results found. ? ?Assessment and Plan:  ?Pregnancy: G1P0 at [redacted]w[redacted]d ? ?1. Supervision of normal first pregnancy, antepartum ?- doing well.  Patient to get paperwork from her job to get approved for work at home during pregnancy ? ?2. Anxiety during pregnancy, antepartum, first trimester ?- clinically stable ?- it's OK to take Hydrxyxine and Wellbutrin that was Rx by PCP.  This was discussed with patient ? ?Preterm labor symptoms and general obstetric precautions including but not limited to vaginal bleeding, contractions, leaking of fluid and fetal movement were reviewed in detail with the patient. ?I discussed the assessment and treatment plan with the patient. The patient was provided an opportunity to ask questions and all were answered. The patient agreed with the plan and demonstrated an understanding of the instructions. The patient was advised to call back or seek an in-person office evaluation/go to MAU at [facility] for any urgent or concerning symptoms. ?Please refer to After Visit Summary for other counseling recommendations.  ? ?I provided 10 minutes of face-to-face time during this encounter. ? ? ?Future Appointments  ?Date Time Provider Department Center  ?02/01/2022  9:35 AM Warden Fillers, MD [facility] None  ?02/01/2022 10:00 AM Gwyndolyn Saxon, LCSW [facility] None  ?03/07/2022 11:15 AM [facility] NURSE [facility]  ?03/07/2022 11:30 AM [facility] US2 [facility]  ? ? ?Coral Ceo, MD ?Center for [facility], [facility], [facility] ?01/22/22  ? ?

## 2022-01-22 NOTE — Progress Notes (Signed)
Pt needs to discuss work situation, would like to be able to work from home and needs MD approval.  ?Pt needs to discuss anxiety and Rx for that- pt currently has Wellbutrin on file but does not take.  ? ? ?

## 2022-02-01 ENCOUNTER — Ambulatory Visit (INDEPENDENT_AMBULATORY_CARE_PROVIDER_SITE_OTHER): Payer: Medicaid Other | Admitting: Obstetrics and Gynecology

## 2022-02-01 ENCOUNTER — Ambulatory Visit (INDEPENDENT_AMBULATORY_CARE_PROVIDER_SITE_OTHER): Payer: Medicaid Other | Admitting: Licensed Clinical Social Worker

## 2022-02-01 VITALS — BP 128/78 | HR 87 | Wt 310.0 lb

## 2022-02-01 DIAGNOSIS — Z8659 Personal history of other mental and behavioral disorders: Secondary | ICD-10-CM

## 2022-02-01 DIAGNOSIS — Z3400 Encounter for supervision of normal first pregnancy, unspecified trimester: Secondary | ICD-10-CM

## 2022-02-01 DIAGNOSIS — Z6841 Body Mass Index (BMI) 40.0 and over, adult: Secondary | ICD-10-CM

## 2022-02-01 DIAGNOSIS — Z3A14 14 weeks gestation of pregnancy: Secondary | ICD-10-CM

## 2022-02-01 DIAGNOSIS — R87620 Atypical squamous cells of undetermined significance on cytologic smear of vagina (ASC-US): Secondary | ICD-10-CM

## 2022-02-01 DIAGNOSIS — R87811 Vaginal high risk human papillomavirus (HPV) DNA test positive: Secondary | ICD-10-CM

## 2022-02-01 DIAGNOSIS — O99210 Obesity complicating pregnancy, unspecified trimester: Secondary | ICD-10-CM

## 2022-02-01 DIAGNOSIS — O2342 Unspecified infection of urinary tract in pregnancy, second trimester: Secondary | ICD-10-CM

## 2022-02-01 DIAGNOSIS — O2341 Unspecified infection of urinary tract in pregnancy, first trimester: Secondary | ICD-10-CM

## 2022-02-01 DIAGNOSIS — O99212 Obesity complicating pregnancy, second trimester: Secondary | ICD-10-CM

## 2022-02-01 HISTORY — DX: Body Mass Index (BMI) 40.0 and over, adult: Z684

## 2022-02-01 NOTE — Progress Notes (Signed)
? ?  PRENATAL VISIT NOTE ? ?Subjective:  ?Heather Ingram is a 21 y.o. G1P0 at [redacted]w[redacted]d being seen today for ongoing prenatal care.  She is currently monitored for the following issues for this high-risk pregnancy and has Supervision of normal first pregnancy, antepartum; UTI (urinary tract infection) in pregnancy in first trimester; Maternal morbid obesity, antepartum ([place]); History of depression; History of anxiety; ASCUS with positive high risk human papillomavirus of vagina; and BMI 50.0-59.9, adult ([place]) on their problem list. ? ?Patient doing well with no acute concerns today. She reports  occasional headache and moderate to excessive nausea .  Contractions: Not present. Vag. Bleeding: None.  Movement: Absent. Denies leaking of fluid.  ? ?Pt is requesting to work from home due to her excessive nausea, especially in the mornings, as well as her anxiety.  She has been seeing behavorial health  regarding her anxiety as well.  I feel that this is a reasonable request to work from home to allow her to continue to work without the concern of getting to a trash can or bathroom in case she needs to vomit.  This can be re-evaluated if her nausea improves throughout the pregnancy. ? ?The following portions of the patient's history were reviewed and updated as appropriate: allergies, current medications, past family history, past medical history, past social history, past surgical history and problem list. Problem list updated. ? ?Objective:  ? ?Vitals:  ? 02/01/22 0942  ?BP: 128/78  ?Pulse: 87  ?Weight: (!) 310 lb (140.6 kg)  ? ? ?Fetal Status: Fetal Heart Rate (bpm): 154   Movement: Absent    ? ?General:  Alert, oriented and cooperative. Patient is in no acute distress.  ?Skin: Skin is warm and dry. No rash noted.   ?Cardiovascular: Normal heart rate noted  ?Respiratory: Normal respiratory effort, no problems with respiration noted  ?Abdomen: Soft, gravid, appropriate for gestational age.  Pain/Pressure: Present      ?Pelvic: Cervical exam deferred        ?Extremities: Normal range of motion.  Edema: None  ?Mental Status:  Normal mood and affect. Normal behavior. Normal judgment and thought content.  ? ?Assessment and Plan:  ?Pregnancy: G1P0 at [redacted]w[redacted]d ? ?1. UTI (urinary tract infection) in pregnancy in first trimester ?Mild dysuria, urine culture pending ? ?2. [redacted] weeks gestation of pregnancy ? ? ?3. Maternal morbid obesity, antepartum ([place]) ? ? ?4. History of anxiety ?Pt continues to see behavorial health ? ?5. ASCUS with positive high risk human papillomavirus of vagina ? ? ?6. BMI 50.0-59.9, adult ([place]) ? ?7. Supervision of routine prenatal care: ?Continue routine care, AFP at next visit ? ? ?Preterm labor symptoms and general obstetric precautions including but not limited to vaginal bleeding, contractions, leaking of fluid and fetal movement were reviewed in detail with the patient. ? ?Please refer to After Visit Summary for other counseling recommendations.  ? ?Return in about 4 weeks (around 03/01/2022) for in person, [facility]. ? ? ?Lynnda Shields, MD ?Faculty Attending ?Center for [place] ?  ?

## 2022-02-01 NOTE — Progress Notes (Signed)
Pt in office for ROB visit. Pt c/o thick white discharge and burning for about 4 days. Pt also has concerns about the OTC prenatal vitamin she is taking. Pt requesting medication for headaches, sleeping and she is experiencing lower back pain.  ?

## 2022-02-01 NOTE — Addendum Note (Signed)
Addended by: Jearld Adjutant on: 02/01/2022 11:09 AM ? ? Modules accepted: Orders ? ?

## 2022-02-03 LAB — URINE CULTURE

## 2022-02-04 NOTE — BH Specialist Note (Signed)
Integrated Behavioral Health Follow Up In-Person Visit ? ?MRN: [id number] ?Name: Heather Ingram ? ?Number of Integrated Behavioral Health Clinician visits: 2- Second Visit ? ?Session Start time: 1008 ?  ?Session End time: 1055 ? ?Total time in minutes: 47 ?In person at [facility]  ? ?Types of Service: Individual psychotherapy ? ?Interpretor:No. Interpretor Name and Language: none ? ?Subjective: ?Heather Ingram is a 21 y.o. female accompanied by Partner/Significant Other ?Patient was referred by Sabas Sous CNM for positive depression screen. ?Patient reports the following symptoms/concerns: depressed mood, feeling overwhelmed, anxiety attacks  ?Duration of problem: approx two months; Severity of problem: mild ? ?Objective: ?Mood: Depressed and Affect: Appropriate ?Risk of harm to self or others: No plan to harm self or others ? ?Life Context: ?Family and Social: Lives with family and father of baby ?School/Work: [employer]  ?Self-Care: Rest  ?Life Changes: New pregnancy ? ?Patient and/or Family's Strengths/Protective Factors: ?Concrete supports in place (healthy food, safe environments, etc.) ? ?Goals Addressed: ?Patient will: ? Reduce symptoms of: depression  ? Increase knowledge and/or ability of: coping skills  ? Demonstrate ability to: Increase healthy adjustment to current life circumstances ? ?Progress towards Goals: ?Ongoing ? ?Interventions: ?Interventions utilized:  Supportive Counseling ?Standardized Assessments completed: PHQ 9 ? ?Patient and/or Family Response: Heather Ingram responded well to scheduled appt  ? ?Assessment: ?Heather Ingram has a history of depression  ? ?Patient may benefit from integrated behavioral health. ? ?Plan: ?Follow up with behavioral health clinician on : 03/01/2022 ?Behavioral recommendations: Mindfulness and relaxation techniques, follow prescription order from medical providers as directed, prioritize rest and delegate task for additional support  ?Referral(s):  Integrated [employer] (In Clinic) ?"From scale of 1-10, how likely are you to follow plan?":   ? ?Gwyndolyn Saxon, LCSW ? ? ?

## 2022-02-19 ENCOUNTER — Ambulatory Visit (INDEPENDENT_AMBULATORY_CARE_PROVIDER_SITE_OTHER): Payer: Medicaid Other | Admitting: Obstetrics

## 2022-02-19 ENCOUNTER — Encounter: Payer: Self-pay | Admitting: Obstetrics

## 2022-02-19 VITALS — BP 121/79 | HR 80 | Wt 310.0 lb

## 2022-02-19 DIAGNOSIS — M549 Dorsalgia, unspecified: Secondary | ICD-10-CM

## 2022-02-19 DIAGNOSIS — Z3400 Encounter for supervision of normal first pregnancy, unspecified trimester: Secondary | ICD-10-CM

## 2022-02-19 DIAGNOSIS — F418 Other specified anxiety disorders: Secondary | ICD-10-CM

## 2022-02-19 DIAGNOSIS — O99210 Obesity complicating pregnancy, unspecified trimester: Secondary | ICD-10-CM

## 2022-02-19 MED ORDER — COMFORT FIT MATERNITY SUPP SM MISC: 0 refills | Status: DC

## 2022-02-19 NOTE — Progress Notes (Signed)
Subjective:  ?Heather Ingram is a 21 y.o. G1P0 at [redacted]w[redacted]d being seen today for ongoing prenatal care.  She is currently monitored for the following issues for this low-risk pregnancy and has Supervision of normal first pregnancy, antepartum; UTI (urinary tract infection) in pregnancy in first trimester; Maternal morbid obesity, antepartum (HCC); History of depression; History of anxiety; ASCUS with positive high risk human papillomavirus of vagina; and BMI 50.0-59.9, adult (HCC) on their problem list. ? ?Patient reports backache and heartburn.  Contractions: Not present. Vag. Bleeding: None.   . Denies leaking of fluid.  ? ?The following portions of the patient's history were reviewed and updated as appropriate: allergies, current medications, past family history, past medical history, past social history, past surgical history and problem list. Problem list updated. ? ?Objective:  ? ?Vitals:  ? 02/19/22 0955  ?BP: 121/79  ?Pulse: 80  ?Weight: (!) 310 lb (140.6 kg)  ? ? ?Fetal Status: Fetal Heart Rate (bpm): 151        ? ?General:  Alert, oriented and cooperative. Patient is in no acute distress.  ?Skin: Skin is warm and dry. No rash noted.   ?Cardiovascular: Normal heart rate noted  ?Respiratory: Normal respiratory effort, no problems with respiration noted  ?Abdomen: Soft, gravid, appropriate for gestational age. Pain/Pressure: Present     ?Pelvic:  Cervical exam deferred        ?Extremities: Normal range of motion.  Edema: Trace  ?Mental Status: Normal mood and affect. Normal behavior. Normal judgment and thought content.  ? ?Urinalysis:     ? ?Assessment and Plan:  ?Pregnancy: G1P0 at [redacted]w[redacted]d ? ?1. Supervision of normal first pregnancy, antepartum ?Rx: ?- AFP, Serum, Open Spina Bifida ? ?2. Backache symptom ?Rx: ?- Elastic Bandages & Supports (COMFORT FIT MATERNITY SUPP SM) MISC; Wear daily as directed.  Dispense: 1 each; Refill: 0 ? ?3. Obesity affecting pregnancy, antepartum ? ?4. Anxiety associated with  depression ?- clinically stable ?  ? ?Preterm labor symptoms and general obstetric precautions including but not limited to vaginal bleeding, contractions, leaking of fluid and fetal movement were reviewed in detail with the patient. ?Please refer to After Visit Summary for other counseling recommendations.  ? ?Return in about 4 weeks (around 03/19/2022) for ROB. ? ? ?Brock Bad, MD  ?02/19/22  ?

## 2022-02-21 LAB — AFP, SERUM, OPEN SPINA BIFIDA
AFP MoM: 1.26
AFP Value: 30.7 ng/mL
Gest. Age on Collection Date: 16.5 weeks
Maternal Age At EDD: 22.5 yr
OSBR Risk 1 IN: 5411
Test Results:: NEGATIVE
Weight: 310 [lb_av]

## 2022-02-26 ENCOUNTER — Encounter (HOSPITAL_COMMUNITY): Payer: Self-pay | Admitting: Obstetrics and Gynecology

## 2022-02-26 ENCOUNTER — Inpatient Hospital Stay (HOSPITAL_COMMUNITY)
Admission: AD | Admit: 2022-02-26 | Discharge: 2022-02-26 | Disposition: A | Discharge status: Home / Self Care | Payer: Medicaid Other | Attending: Obstetrics and Gynecology | Admitting: Obstetrics and Gynecology

## 2022-02-26 DIAGNOSIS — Z3A17 17 weeks gestation of pregnancy: Secondary | ICD-10-CM | POA: Insufficient documentation

## 2022-02-26 DIAGNOSIS — O219 Vomiting of pregnancy, unspecified: Secondary | ICD-10-CM | POA: Insufficient documentation

## 2022-02-26 DIAGNOSIS — R300 Dysuria: Secondary | ICD-10-CM

## 2022-02-26 DIAGNOSIS — O2312 Infections of bladder in pregnancy, second trimester: Secondary | ICD-10-CM | POA: Insufficient documentation

## 2022-02-26 DIAGNOSIS — O2341 Unspecified infection of urinary tract in pregnancy, first trimester: Secondary | ICD-10-CM

## 2022-02-26 DIAGNOSIS — K219 Gastro-esophageal reflux disease without esophagitis: Secondary | ICD-10-CM | POA: Insufficient documentation

## 2022-02-26 DIAGNOSIS — R600 Localized edema: Secondary | ICD-10-CM

## 2022-02-26 DIAGNOSIS — O26899 Other specified pregnancy related conditions, unspecified trimester: Secondary | ICD-10-CM

## 2022-02-26 DIAGNOSIS — N3090 Cystitis, unspecified without hematuria: Secondary | ICD-10-CM

## 2022-02-26 DIAGNOSIS — O99612 Diseases of the digestive system complicating pregnancy, second trimester: Secondary | ICD-10-CM | POA: Insufficient documentation

## 2022-02-26 DIAGNOSIS — O1202 Gestational edema, second trimester: Secondary | ICD-10-CM | POA: Diagnosis not present

## 2022-02-26 DIAGNOSIS — Z87440 Personal history of urinary (tract) infections: Secondary | ICD-10-CM | POA: Insufficient documentation

## 2022-02-26 LAB — URINALYSIS, ROUTINE W REFLEX MICROSCOPIC
Bilirubin Urine: NEGATIVE
Glucose, UA: NEGATIVE mg/dL
Hgb urine dipstick: NEGATIVE
Ketones, ur: NEGATIVE mg/dL
Nitrite: NEGATIVE
Protein, ur: NEGATIVE mg/dL
Specific Gravity, Urine: 1.019 (ref 1.005–1.030)
pH: 8 (ref 5.0–8.0)

## 2022-02-26 LAB — WET PREP, GENITAL
Clue Cells Wet Prep HPF POC: NONE SEEN
Sperm: NONE SEEN
Trich, Wet Prep: NONE SEEN
WBC, Wet Prep HPF POC: >=10 — AB (ref <10)
Yeast Wet Prep HPF POC: NONE SEEN

## 2022-02-26 MED ORDER — ONDANSETRON HCL 4 MG PO TABS: 4.0000 mg | ORAL_TABLET | Freq: Every day | ORAL | 0 refills | Status: AC | PRN

## 2022-02-26 MED ORDER — FAMOTIDINE 20 MG PO TABS: 20.0000 mg | ORAL_TABLET | Freq: Two times a day (BID) | ORAL | 1 refills | Status: DC

## 2022-02-26 MED ORDER — DOXYLAMINE-PYRIDOXINE 10-10 MG PO TBEC: 1.0000 | DELAYED_RELEASE_TABLET | Freq: Two times a day (BID) | ORAL | 2 refills | Status: DC | PRN

## 2022-02-26 MED ORDER — CEFPODOXIME PROXETIL 100 MG PO TABS: 100.0000 mg | ORAL_TABLET | Freq: Two times a day (BID) | ORAL | 0 refills | Status: AC

## 2022-02-26 MED ORDER — FAMOTIDINE 20 MG PO TABS
20.0000 mg | ORAL_TABLET | Freq: Once | ORAL | Status: AC
  Administered 2022-02-26: 20 mg via ORAL
  Filled 2022-02-26: qty 1

## 2022-02-26 NOTE — MAU Provider Note (Addendum)
?History  ?  ? ?CSN: [id number] ? ?Arrival date and time: 02/26/22 1304 ? ? None  ?  ? ?Chief Complaint  ?Patient presents with  ? Abdominal Pain  ? Dysuria  ? Emesis  ? ?HPI ?21 yo F G1P0 at [redacted]w[redacted]d who presents to MAU for vaginal discharge and nausea ? ?#Nausea ?#Vomiting ?Worse over past three days ?Reports she has had nausea off and on for past few days ?Can keep down liquids ?Tried eating bacon/egg/cheese croissant today and didn't keep down ?Also reports heart burn ?Currently doesn't want any nausea medications  ?Would like something for her heart burn ? ?#Vaginal discharge ?Reports couple of days thick white discharge ?Also reports odor ?Denies any fevers or chills ?Reports mild abdominal cramping ? ? ? ? ?OB History   ? ? Gravida  ?1  ? Para  ?   ? Term  ?   ? Preterm  ?   ? AB  ?   ? Living  ?   ?  ? ? SAB  ?   ? IAB  ?   ? Ectopic  ?   ? Multiple  ?   ? Live Births  ?   ?   ?  ?  ? ? ?Past Medical History:  ?Diagnosis Date  ? Medical history non-contributory   ? ? ?Past Surgical History:  ?Procedure Laterality Date  ? NO PAST SURGERIES    ? ? ?Family History  ?Problem Relation Age of Onset  ? Diabetes Maternal Aunt   ? Healthy Mother   ? Healthy Father   ? ? ?Social History  ? ?Tobacco Use  ? Smoking status: Never  ? Smokeless tobacco: Never  ?Vaping Use  ? Vaping Use: Former  ? Start date: 10/21/2018  ? Quit date: 12/09/2021  ? Substances: Nicotine  ?Substance Use Topics  ? Alcohol use: Never  ? Drug use: Never  ? ? ?Allergies: No Known Allergies ? ?Medications Prior to Admission  ?Medication Sig Dispense Refill Last Dose  ? Prenatal Vit-Fe Fumarate-FA (PRENATAL MULTIVITAMIN) TABS tablet Take 1 tablet by mouth daily at 12 noon.   02/25/2022  ? Blood Pressure Monitoring (BLOOD PRESSURE KIT) DEVI 1 kit by Does not apply route as needed. Large cuff 1 each 0   ? buPROPion (WELLBUTRIN XL) 150 MG 24 hr tablet Take 150 mg by mouth daily. (Patient not taking: Reported on 01/22/2022)     ? cefadroxil (DURICEF) 500 MG capsule  Take 1 capsule (500 mg total) by mouth 2 (two) times daily. (Patient not taking: Reported on 01/22/2022) 14 capsule 0   ? Elastic Bandages & Supports (COMFORT FIT MATERNITY SUPP SM) MISC Wear daily as directed. 1 each 0   ? hydrOXYzine (VISTARIL) 25 MG capsule Take 1 capsule (25 mg total) by mouth 3 (three) times daily as needed. (Patient not taking: Reported on 01/22/2022) 30 capsule 5   ? Misc. Devices (GOJJI WEIGHT SCALE) MISC 1 Device by Does not apply route as needed. 1 each 0   ? ? ?Review of Systems  ?Constitutional:  Negative for chills and fever.  ?Gastrointestinal:  Positive for nausea. Negative for abdominal distention, blood in stool, constipation, diarrhea and vomiting.  ?Endocrine: Negative for polyuria.  ?Genitourinary:  Positive for vaginal discharge. Negative for dysuria, flank pain, frequency, hematuria and vaginal bleeding.  ?Physical Exam  ? ?Blood pressure 134/89, pulse (!) 105, temperature 99.1 ?F (37.3 ?C), resp. rate 18, height 5' (1.524 m), weight (!) 141.1 kg, last menstrual period  10/25/2021, SpO2 99 %. ? ?Physical Exam ?Vitals and nursing note reviewed.  ?HENT:  ?   Head: Normocephalic.  ?Cardiovascular:  ?   Rate and Rhythm: Normal rate and regular rhythm.  ?Abdominal:  ?   General: Bowel sounds are normal.  ?   Palpations: Abdomen is soft.  ?   Tenderness: There is abdominal tenderness.  ?   Comments: Mild LLQ TTP  ?Skin: ?   General: Skin is warm.  ?Neurological:  ?   General: No focal deficit present.  ?   Mental Status: She is alert.  ? ? ?MAU Course  ?Procedures ? ?MDM ?Moderate ?21 yo F G1P0 at [redacted]w[redacted]d who presents to MAU for vaginal discharge and nausea.  Hemodynamically stable.  Nausea resolved by the time patient seen.  As needed medications sent to patient's pharmacy.  ? ?Wet prep done for her vaginal discharge which was normal. GC/CT swab also sent. ? ? ? ?Assessment and Plan  ? ?Nausea and vomiting of pregnancy ?2. GERD ?Presented after few days of nausea and some episodes of  vomiting when eating solids. Able to keep down liquids. Currently no nausea but has some heartburn.  ?- nausea meds sent to patient's pharmacy ?- also sent Famotidine ?- discussed foods to avoid for nausea and also GERD ? ? ?2.  Vaginal discharge ?- wet prep done- normal. GC/CT sent. Will follow up via MyChart if abnormal ? ?3. Cystitis ? Urine culture sent. Patient with previous hx of UTI treated with [place] on 3/13 and TOC negative on 4/14.  ?UA today with leukocytes and bacteria. ?- will treat for UTI, Cefpodoxime since last time shown to be susceptible. ? ?4. Generalized bilateral lower extremity edema ?Patient reports worsening edema of lower extremities. BP today normotensive (one read 134/89 but patient talking during read). Reports improving. No history of injury.  On exam no TTP of calf. Mild TTP of left foot at side of foot. ?- Discussed compression stockings.  ?- Discussed elevation at rest ?- discussed return precautions including preE symptoms ? ? ? ?Renard Matter, MD, MPH ?OB Fellow, Faculty Practice ? ?

## 2022-02-26 NOTE — Discharge Instructions (Signed)
You came to the MAU with nausea and heart burn. You wanted medications sent to your pharmacy which we sent ? ?You also reported burning with peeing and vaginal discharge. We checked your urine and think you have another UTI. We sent medications to your pharmacy and you should have a repeat urine test in 2-3 weeks in the office. We also sent swabs and someone will reach out to you on MyChart if anything is abnormal with it. ? ?Please seek medical care if you have fevers, chills, vomiting so bad you arent able to keep food down. ? ? ?

## 2022-02-26 NOTE — MAU Note (Signed)
.  Heather Ingram is a 21 y.o. at [redacted]w[redacted]d here in MAU reporting: vomiting worsening over the last 3 days. Also reports a vaginal discharge for the last 3 days and it is thick and white. Reports vaginal itching and burning with urination. Has noted that bother her lower extremities have been swollen for one week. Fatigue.  ? ?Onset of complaint: 3 days ?Pain score: 4/10 ?There were no vitals filed for this visit.   ?Lab orders placed from triage: urine ? ? ?

## 2022-02-26 NOTE — MAU Note (Signed)
Called Main Lab regarding patient's urine that is not processing. Verline Lema in [employer] stated she would run the sample now. ?

## 2022-02-27 LAB — GC/CHLAMYDIA PROBE AMP (~~LOC~~) NOT AT ARMC
Chlamydia: NEGATIVE
Comment: NEGATIVE
Comment: NORMAL
Neisseria Gonorrhea: NEGATIVE

## 2022-02-28 LAB — CULTURE, OB URINE

## 2022-03-01 ENCOUNTER — Encounter: Payer: Self-pay | Admitting: Obstetrics and Gynecology

## 2022-03-01 ENCOUNTER — Ambulatory Visit (INDEPENDENT_AMBULATORY_CARE_PROVIDER_SITE_OTHER): Payer: Medicaid Other | Admitting: Obstetrics and Gynecology

## 2022-03-01 VITALS — BP 126/77 | HR 91 | Wt 310.0 lb

## 2022-03-01 DIAGNOSIS — Z8659 Personal history of other mental and behavioral disorders: Secondary | ICD-10-CM

## 2022-03-01 DIAGNOSIS — Z3400 Encounter for supervision of normal first pregnancy, unspecified trimester: Secondary | ICD-10-CM

## 2022-03-01 DIAGNOSIS — R300 Dysuria: Secondary | ICD-10-CM | POA: Insufficient documentation

## 2022-03-01 NOTE — Patient Instructions (Signed)

## 2022-03-01 NOTE — Progress Notes (Signed)
Subjective:  ?Heather Ingram is a 21 y.o. G1P0 at [redacted]w[redacted]d being seen today for ongoing prenatal care.  She is currently monitored for the following issues for this low-risk pregnancy and has Supervision of normal first pregnancy, antepartum; Maternal morbid obesity, antepartum (HCC); History of depression; History of anxiety; ASCUS with positive high risk human papillomavirus of vagina; BMI 50.0-59.9, adult (HCC); and Dysuria on their problem list. ? ?Patient reports nausea.  Contractions: Not present. Vag. Bleeding: None.  Movement: Present. Denies leaking of fluid.  ? ?The following portions of the patient's history were reviewed and updated as appropriate: allergies, current medications, past family history, past medical history, past social history, past surgical history and problem list. Problem list updated. ? ?Objective:  ? ?Vitals:  ? 03/01/22 0856  ?BP: 126/77  ?Pulse: 91  ?Weight: (!) 310 lb (140.6 kg)  ? ? ?Fetal Status: Fetal Heart Rate (bpm): 155   Movement: Present    ? ?General:  Alert, oriented and cooperative. Patient is in no acute distress.  ?Skin: Skin is warm and dry. No rash noted.   ?Cardiovascular: Normal heart rate noted  ?Respiratory: Normal respiratory effort, no problems with respiration noted  ?Abdomen: Soft, gravid, appropriate for gestational age. Pain/Pressure: Absent     ?Pelvic:  Cervical exam deferred        ?Extremities: Normal range of motion.     ?Mental Status: Normal mood and affect. Normal behavior. Normal judgment and thought content.  ? ?Urinalysis:     ? ?Assessment and Plan:  ?Pregnancy: G1P0 at [redacted]w[redacted]d ? ?1. Supervision of normal first pregnancy, antepartum ?Stable ?Reviewed Sx of N/V. See recent MAU note ?Pt has not picked up meds yet ?Pt encouraged to do so and follow diet suggestions as discussed previously ?Pt provided work excused for 4 days of missed work. Pt is aware, that we will not do this again. Work restriction papers have already been completed and pt  informed, will not adjust. ?If feeling so sick as needs to miss work, pt instructed to be seen by MD for evaluation ?Pt verbalized understanding ? ?2. History of depression ?Stable ? ?3. History of anxiety ?Stable ? ?4. Dysuria ?Repeat UC today ?Pt instructed on how to perform a clean catch urine ? ?Preterm labor symptoms and general obstetric precautions including but not limited to vaginal bleeding, contractions, leaking of fluid and fetal movement were reviewed in detail with the patient. ?Please refer to After Visit Summary for other counseling recommendations.  ?Return in about 4 weeks (around 03/29/2022) for OB visit, face to face, any provider. ? ? ?Hermina Staggers, MD ?

## 2022-03-01 NOTE — Progress Notes (Signed)
Pt seen a few days ago for discharge, ?urinary symptoms. ?Pt has burning with urination- will recollect sample today.  ? ?

## 2022-03-04 LAB — URINE CULTURE

## 2022-03-07 ENCOUNTER — Other Ambulatory Visit: Payer: Self-pay | Admitting: *Deleted

## 2022-03-07 ENCOUNTER — Ambulatory Visit: Payer: Medicaid Other

## 2022-03-07 ENCOUNTER — Ambulatory Visit: Payer: Medicaid Other | Admitting: *Deleted

## 2022-03-07 ENCOUNTER — Encounter: Payer: Self-pay | Admitting: *Deleted

## 2022-03-07 VITALS — BP 128/76 | HR 93

## 2022-03-07 DIAGNOSIS — O99212 Obesity complicating pregnancy, second trimester: Secondary | ICD-10-CM | POA: Insufficient documentation

## 2022-03-07 DIAGNOSIS — Z363 Encounter for antenatal screening for malformations: Secondary | ICD-10-CM | POA: Insufficient documentation

## 2022-03-07 DIAGNOSIS — O99210 Obesity complicating pregnancy, unspecified trimester: Secondary | ICD-10-CM

## 2022-03-07 DIAGNOSIS — Z3689 Encounter for other specified antenatal screening: Secondary | ICD-10-CM

## 2022-03-07 DIAGNOSIS — Z362 Encounter for other antenatal screening follow-up: Secondary | ICD-10-CM

## 2022-03-07 DIAGNOSIS — Z3400 Encounter for supervision of normal first pregnancy, unspecified trimester: Secondary | ICD-10-CM

## 2022-03-07 DIAGNOSIS — Z3A19 19 weeks gestation of pregnancy: Secondary | ICD-10-CM | POA: Insufficient documentation

## 2022-03-22 ENCOUNTER — Other Ambulatory Visit: Payer: Self-pay

## 2022-03-22 ENCOUNTER — Emergency Department (HOSPITAL_BASED_OUTPATIENT_CLINIC_OR_DEPARTMENT_OTHER)
Admission: EM | Admit: 2022-03-22 | Discharge: 2022-03-22 | Disposition: A | Discharge status: Home / Self Care | Payer: Medicaid Other | Attending: Emergency Medicine | Admitting: Emergency Medicine

## 2022-03-22 ENCOUNTER — Encounter (HOSPITAL_BASED_OUTPATIENT_CLINIC_OR_DEPARTMENT_OTHER): Payer: Self-pay | Admitting: Emergency Medicine

## 2022-03-22 DIAGNOSIS — J069 Acute upper respiratory infection, unspecified: Secondary | ICD-10-CM | POA: Insufficient documentation

## 2022-03-22 DIAGNOSIS — O99512 Diseases of the respiratory system complicating pregnancy, second trimester: Secondary | ICD-10-CM | POA: Insufficient documentation

## 2022-03-22 DIAGNOSIS — O26892 Other specified pregnancy related conditions, second trimester: Secondary | ICD-10-CM | POA: Diagnosis present

## 2022-03-22 DIAGNOSIS — Z3A20 20 weeks gestation of pregnancy: Secondary | ICD-10-CM | POA: Diagnosis not present

## 2022-03-22 DIAGNOSIS — Z20822 Contact with and (suspected) exposure to covid-19: Secondary | ICD-10-CM | POA: Insufficient documentation

## 2022-03-22 HISTORY — DX: Encounter for supervision of normal pregnancy, unspecified, unspecified trimester: Z34.90

## 2022-03-22 LAB — RESP PANEL BY RT-PCR (RSV, FLU A&B, COVID)  RVPGX2
Influenza A by PCR: NEGATIVE
Influenza B by PCR: NEGATIVE
Resp Syncytial Virus by PCR: NEGATIVE
SARS Coronavirus 2 by RT PCR: NEGATIVE

## 2022-03-22 LAB — GROUP A STREP BY PCR: Group A Strep by PCR: NOT DETECTED

## 2022-03-22 NOTE — ED Triage Notes (Signed)
Pt request strep and flu test, having congestion, body aches, chills and sore throat. X 2 days

## 2022-03-22 NOTE — ED Provider Notes (Signed)
[facility] EMERGENCY DEPARTMENT  Provider Note  CSN: [id number] Arrival date & time: 03/22/22 0121  History Chief Complaint  Patient presents with   URI   Sore Throat    Heather Ingram is a 21 y.o. female G1P0 approx 20weeks here with 2 days of nasal congestion, sore throat, cough and subjective fevers. She has had nausea with her pregnancy but that is unchanged. No pregnancy related complaints tonight.    Home Medications Prior to Admission medications   Medication Sig Start Date End Date Taking? Authorizing Provider  Blood Pressure Monitoring (BLOOD PRESSURE KIT) DEVI 1 kit by Does not apply route as needed. Large cuff 12/28/21   Anyanwu, Jethro Bastos, MD  Doxylamine-Pyridoxine (DICLEGIS) 10-10 MG TBEC Take 1 tablet by mouth 2 (two) times daily as needed (Nausea). Patient not taking: Reported on 03/07/2022 02/26/22   Warner Mccreedy, MD  Elastic Bandages & Supports (COMFORT FIT MATERNITY SUPP SM) MISC Wear daily as directed. 02/19/22   Brock Bad, MD  famotidine (PEPCID) 20 MG tablet Take 1 tablet (20 mg total) by mouth 2 (two) times daily. Patient not taking: Reported on 03/07/2022 02/26/22 02/26/23  Warner Mccreedy, MD  Misc. Devices (GOJJI WEIGHT SCALE) MISC 1 Device by Does not apply route as needed. 12/28/21   Anyanwu, Jethro Bastos, MD  Prenatal Vit-Fe Fumarate-FA (PRENATAL MULTIVITAMIN) TABS tablet Take 1 tablet by mouth daily at 12 noon.    [provider]     Allergies    Patient has no known allergies.   Review of Systems   Review of Systems Please see HPI for pertinent positives and negatives  Physical Exam BP 127/77 (BP Location: Right Arm)   Pulse 100   Temp 98.6 F (37 C) (Oral)   Resp 20   Ht 5\' 2"  (1.575 m)   Wt (!) 138.3 kg   LMP 10/25/2021 Comment: pt shielded.  SpO2 100%   BMI 55.79 kg/m   Physical Exam Vitals and nursing note reviewed.  Constitutional:      Appearance: Normal appearance.  HENT:     Head: Normocephalic and  atraumatic.     Nose: Nose normal.     Mouth/Throat:     Mouth: Mucous membranes are moist. No oral lesions.     Pharynx: Uvula midline. No oropharyngeal exudate or posterior oropharyngeal erythema.     Tonsils: No tonsillar exudate or tonsillar abscesses.  Eyes:     Extraocular Movements: Extraocular movements intact.     Conjunctiva/sclera: Conjunctivae normal.  Cardiovascular:     Rate and Rhythm: Normal rate.  Pulmonary:     Effort: Pulmonary effort is normal.     Breath sounds: Normal breath sounds.  Abdominal:     General: Abdomen is flat.     Palpations: Abdomen is soft.     Tenderness: There is no abdominal tenderness.  Musculoskeletal:        General: No swelling. Normal range of motion.     Cervical back: Neck supple.  Lymphadenopathy:     Cervical: No cervical adenopathy.  Skin:    General: Skin is warm and dry.  Neurological:     General: No focal deficit present.     Mental Status: She is alert.  Psychiatric:        Mood and Affect: Mood normal.    ED Results / Procedures / Treatments   EKG None  Procedures Procedures  Medications Ordered in the ED Medications - No data to display  Initial  Impression and Plan  Patient with URI symptoms, requesting strep and flu tests. Otherwise she looks well.   ED Course   Clinical Course as of 03/22/22 0312  Fri Mar 22, 2022  0301 Strep is negative.  [CS]  3143 Covid/Flu are neg.  [CS]  [id number] Patient advised to continue with symptomatic care at home. Ob/Gyn or MAU follow up for any pregnancy related complaints.  [CS]    Clinical Course User Index [CS] Truddie Hidden, MD     MDM Rules/Calculators/A&P Medical Decision Making Problems Addressed: Viral upper respiratory tract infection: acute illness or injury  Amount and/or Complexity of Data Reviewed Labs: ordered. Decision-making details documented in ED Course.  Risk OTC drugs.    Final Clinical Impression(s) / ED Diagnoses Final diagnoses:   Viral upper respiratory tract infection    Rx / DC Orders ED Discharge Orders     None        Truddie Hidden, MD 03/22/22 [phone]

## 2022-03-29 ENCOUNTER — Encounter: Payer: Self-pay | Admitting: Obstetrics and Gynecology

## 2022-03-29 ENCOUNTER — Telehealth (INDEPENDENT_AMBULATORY_CARE_PROVIDER_SITE_OTHER): Payer: Medicaid Other | Admitting: Obstetrics and Gynecology

## 2022-03-29 DIAGNOSIS — Z3400 Encounter for supervision of normal first pregnancy, unspecified trimester: Secondary | ICD-10-CM

## 2022-03-29 DIAGNOSIS — Z8659 Personal history of other mental and behavioral disorders: Secondary | ICD-10-CM

## 2022-03-29 NOTE — Patient Instructions (Signed)

## 2022-03-29 NOTE — Progress Notes (Signed)
I connected with Heather Ingram 03/29/22 at  8:35 AM EDT by: MyChart video and verified that I am speaking with the correct person using two identifiers.  Patient is located at home and provider is located at [place].     I discussed the limitations, risks, security and privacy concerns of performing an evaluation and management service by MyChart video and the availability of in person appointments. I also discussed with the patient that there may be a patient responsible charge related to this service. By engaging in this virtual visit, you consent to the provision of healthcare.  Additionally, you authorize for your insurance to be billed for the services provided during this visit.  The patient expressed understanding and agreed to proceed.  The following staff members participated in the virtual visit:  Nurse and MD    PRENATAL VISIT NOTE  Subjective:  Heather Ingram is a 21 y.o. G1P0 at [redacted]w[redacted]d  for virtual visit for ongoing prenatal care.  She is currently monitored for the following issues for this low-risk pregnancy and has Supervision of normal first pregnancy, antepartum; Maternal morbid obesity, antepartum (HCC); History of depression; History of anxiety; ASCUS with positive high risk human papillomavirus of vagina; and BMI 50.0-59.9, adult (HCC) on their problem list.  Patient reports no complaints.  Contractions: Not present. Vag. Bleeding: None.  Movement: Present. Denies leaking of fluid.   The following portions of the patient's history were reviewed and updated as appropriate: allergies, current medications, past family history, past medical history, past social history, past surgical history and problem list.   Objective:  There were no vitals filed for this visit. Self-Obtained  Fetal Status:     Movement: Present     Assessment and Plan:  Pregnancy: G1P0 at [redacted]w[redacted]d 1. Supervision of normal first pregnancy, antepartum Stable U/S 04/08/22 Glucola next  visit  2. History of anxiety Stable  3. History of depression Stable  Preterm labor symptoms and general obstetric precautions including but not limited to vaginal bleeding, contractions, leaking of fluid and fetal movement were reviewed in detail with the patient.  Return in about 4 weeks (around 04/26/2022) for OB visit, face to face, any provider, fasting appt for Glucola.  Future Appointments  Date Time Provider Department Center  04/08/2022  8:30 AM [facility] NURSE [facility]  04/08/2022  8:45 AM [facility] US4 [facility]     Time spent on virtual visit: 8 minutes  Hermina Staggers, MD

## 2022-03-29 NOTE — Progress Notes (Signed)
I connected with  Heather Ingram on 03/29/22 by a video enabled telemedicine application and verified that I am speaking with the correct person using two identifiers.   I discussed the limitations of evaluation and management by telemedicine. The patient expressed understanding and agreed to proceed.   MyChart OB, reports no concerns today.  She is unable to check her vitals at time.

## 2022-04-01 ENCOUNTER — Encounter: Payer: Self-pay | Admitting: Advanced Practice Midwife

## 2022-04-01 ENCOUNTER — Encounter (HOSPITAL_COMMUNITY): Payer: Self-pay | Admitting: Obstetrics and Gynecology

## 2022-04-01 ENCOUNTER — Other Ambulatory Visit: Payer: Self-pay

## 2022-04-01 ENCOUNTER — Inpatient Hospital Stay (HOSPITAL_COMMUNITY)
Admission: AD | Admit: 2022-04-01 | Discharge: 2022-04-01 | Disposition: A | Discharge status: Home / Self Care | Payer: Medicaid Other | Attending: Obstetrics and Gynecology | Admitting: Obstetrics and Gynecology

## 2022-04-01 ENCOUNTER — Telehealth: Payer: Self-pay

## 2022-04-01 DIAGNOSIS — Z3A22 22 weeks gestation of pregnancy: Secondary | ICD-10-CM | POA: Insufficient documentation

## 2022-04-01 DIAGNOSIS — O368120 Decreased fetal movements, second trimester, not applicable or unspecified: Secondary | ICD-10-CM | POA: Insufficient documentation

## 2022-04-01 DIAGNOSIS — R109 Unspecified abdominal pain: Secondary | ICD-10-CM | POA: Insufficient documentation

## 2022-04-01 DIAGNOSIS — Z87891 Personal history of nicotine dependence: Secondary | ICD-10-CM | POA: Insufficient documentation

## 2022-04-01 DIAGNOSIS — O4692 Antepartum hemorrhage, unspecified, second trimester: Secondary | ICD-10-CM | POA: Insufficient documentation

## 2022-04-01 DIAGNOSIS — O26892 Other specified pregnancy related conditions, second trimester: Secondary | ICD-10-CM | POA: Insufficient documentation

## 2022-04-01 DIAGNOSIS — Z87440 Personal history of urinary (tract) infections: Secondary | ICD-10-CM | POA: Diagnosis not present

## 2022-04-01 LAB — URINALYSIS, ROUTINE W REFLEX MICROSCOPIC
Bilirubin Urine: NEGATIVE
Glucose, UA: NEGATIVE mg/dL
Hgb urine dipstick: NEGATIVE
Ketones, ur: NEGATIVE mg/dL
Nitrite: NEGATIVE
Protein, ur: NEGATIVE mg/dL
Specific Gravity, Urine: 1.015 (ref 1.005–1.030)
Squamous Epithelial / HPF: >50 — ABNORMAL HIGH (ref 0–5)
pH: 6 (ref 5.0–8.0)

## 2022-04-01 LAB — WET PREP, GENITAL
Sperm: NONE SEEN
Trich, Wet Prep: NONE SEEN
WBC, Wet Prep HPF POC: >=10 — AB (ref <10)
Yeast Wet Prep HPF POC: NONE SEEN

## 2022-04-01 NOTE — Telephone Encounter (Signed)
Pt called stating she did not feel any fetal movement today. Advised patient to drink a cold beverage, walk around her house, and then go lay down in a quiet place focusing only on movement.  Pt returned phone call stating she did feel fetal movement.   No further questions at this time.

## 2022-04-01 NOTE — MAU Note (Signed)
Heather Ingram is a 21 y.o. at 101w4d here in MAU reporting: been having cramping in lower abd for the last 2 days.  Today has not felt any fetal movement. Had some spotting last night, not sure if she scratched herself or what, only saw it once. No LOF.  Onset of complaint: 2 days ago Pain score: mild Vitals:   04/01/22 1843  BP: 126/86  Pulse: 94  Resp: 20  Temp: 98.4 F (36.9 C)  SpO2: 97%     FHT:162 Lab orders placed from triage:  urine

## 2022-04-01 NOTE — MAU Provider Note (Signed)
None     Chief Complaint:  Vaginal Bleeding, Abdominal Pain, and Decreased Fetal Movement   Jacolyn A Emma-Lee Oddo is  21 y.o. G1P0 at [redacted]w[redacted]d presents complaining of Vaginal Bleeding, Abdominal Pain, and Decreased Fetal Movement  Hasn't felt the baby move as much today as normal.  Had some cramping that woke her up 2 nights ago.  Has had milder intermittent cramping since then.  Saw some blood on a toilet tissue, but thinks it came from her skin where she nicked it.   Rx'd abx for UTI several weeks ago, only took 1/2 of them. However, urine cx was negative.   Obstetrical/Gynecological History: OB History     Gravida  1   Para      Term      Preterm      AB      Living         SAB      IAB      Ectopic      Multiple      Live Births             Past Medical History: Past Medical History:  Diagnosis Date   Medical history non-contributory    Pregnant and not yet delivered     Past Surgical History: Past Surgical History:  Procedure Laterality Date   NO PAST SURGERIES      Family History: Family History  Problem Relation Age of Onset   Diabetes Maternal Aunt    Healthy Mother    Healthy Father     Social History: Social History   Tobacco Use   Smoking status: Never   Smokeless tobacco: Never  Vaping Use   Vaping Use: Former   Start date: 10/21/2018   Quit date: 12/09/2021   Substances: Nicotine  Substance Use Topics   Alcohol use: Never   Drug use: Never    Allergies: No Known Allergies  Meds:  No medications prior to admission.    Review of Systems   Constitutional: Negative for fever and chills Eyes: Negative for visual disturbances Respiratory: Negative for shortness of breath, dyspnea Cardiovascular: Negative for chest pain or palpitations  Gastrointestinal: Negative for vomiting, diarrhea and constipation Genitourinary: Negative for dysuria and urgency Musculoskeletal: Negative for back pain, joint pain, myalgias.   Normal ROM  Neurological: Negative for dizziness and headaches    Physical Exam  Blood pressure (!) 141/79, pulse 89, temperature 98.2 F (36.8 C), temperature source Oral, resp. rate 18, height 5\' 2"  (1.575 m), weight (!) 143.2 kg, last menstrual period 10/25/2021, SpO2 98 %. GENERAL: Well-developed, well-nourished female in no acute distress.  LUNGS: Normal respiratory effort HEART: Regular rate and rhythm. ABDOMEN: Soft, nontender, nondistended.  FHR dopplered several times w/audible/palpable movement, not felt by pt.  EXTREMITIES: Nontender, no edema, 2+ distal pulses. DTR's 2+ PELVIC:  SSE:  small amount of thin white dc w/amine odor.  No blood at all, cx non friable.  CERVICAL EXAM: Dilatation 0cm   Effacement 0%     Labs: Results for orders placed or performed during the hospital encounter of 04/01/22 (from the past 24 hour(s))  Urinalysis, Routine w reflex microscopic Urine, Clean Catch   Collection Time: 04/01/22  7:11 PM  Result Value Ref Range   Color, Urine YELLOW YELLOW   APPearance CLOUDY (A) CLEAR   Specific Gravity, Urine 1.015 1.005 - 1.030   pH 6.0 5.0 - 8.0   Glucose, UA NEGATIVE NEGATIVE mg/dL   Hgb urine dipstick NEGATIVE  NEGATIVE   Bilirubin Urine NEGATIVE NEGATIVE   Ketones, ur NEGATIVE NEGATIVE mg/dL   Protein, ur NEGATIVE NEGATIVE mg/dL   Nitrite NEGATIVE NEGATIVE   Leukocytes,Ua LARGE (A) NEGATIVE   RBC / HPF 6-10 0 - 5 RBC/hpf   WBC, UA 21-50 0 - 5 WBC/hpf   Bacteria, UA RARE (A) NONE SEEN   Squamous Epithelial / LPF >50 (H) 0 - 5   Mucus PRESENT    Non Squamous Epithelial 0-5 (A) NONE SEEN  Wet prep, genital   Collection Time: 04/01/22  7:16 PM   Specimen: PATH Cytology Cervicovaginal Ancillary Only  Result Value Ref Range   Yeast Wet Prep HPF POC NONE SEEN NONE SEEN   Trich, Wet Prep NONE SEEN NONE SEEN   Clue Cells Wet Prep HPF POC PRESENT (A) NONE SEEN   WBC, Wet Prep HPF POC >=10 (A) <10   Sperm NONE SEEN    Imaging Studies:    Assessment: Aiyannah A Merinda Victorino is  21 y.o. G1P0 at [redacted]w[redacted]d presents with decreased FM/cramping/BV.  Marland Kitchen Pt reassured after hearing FHT and fetal movement.   Plan: Rx flagyl  Jacklyn Shell 6/12/20238:32 PM

## 2022-04-02 LAB — GC/CHLAMYDIA PROBE AMP (~~LOC~~) NOT AT ARMC
Chlamydia: NEGATIVE
Comment: NEGATIVE
Comment: NORMAL
Neisseria Gonorrhea: NEGATIVE

## 2022-04-08 ENCOUNTER — Ambulatory Visit: Payer: Medicaid Other | Admitting: *Deleted

## 2022-04-08 ENCOUNTER — Encounter: Payer: Self-pay | Admitting: *Deleted

## 2022-04-08 ENCOUNTER — Ambulatory Visit: Payer: Medicaid Other | Attending: Maternal & Fetal Medicine

## 2022-04-08 ENCOUNTER — Other Ambulatory Visit: Payer: Self-pay | Admitting: *Deleted

## 2022-04-08 VITALS — BP 128/81 | HR 90

## 2022-04-08 DIAGNOSIS — Z362 Encounter for other antenatal screening follow-up: Secondary | ICD-10-CM | POA: Diagnosis present

## 2022-04-08 DIAGNOSIS — E669 Obesity, unspecified: Secondary | ICD-10-CM | POA: Diagnosis not present

## 2022-04-08 DIAGNOSIS — O99212 Obesity complicating pregnancy, second trimester: Secondary | ICD-10-CM | POA: Insufficient documentation

## 2022-04-08 DIAGNOSIS — Z6841 Body Mass Index (BMI) 40.0 and over, adult: Secondary | ICD-10-CM | POA: Diagnosis present

## 2022-04-08 DIAGNOSIS — Z3A23 23 weeks gestation of pregnancy: Secondary | ICD-10-CM

## 2022-04-18 ENCOUNTER — Other Ambulatory Visit: Payer: Self-pay | Admitting: Advanced Practice Midwife

## 2022-04-18 MED ORDER — METRONIDAZOLE 500 MG PO TABS: 500.0000 mg | ORAL_TABLET | Freq: Two times a day (BID) | ORAL | 0 refills | Status: DC

## 2022-04-18 NOTE — Progress Notes (Signed)
Flagyl for BV 

## 2022-04-26 ENCOUNTER — Other Ambulatory Visit: Payer: Medicaid Other

## 2022-04-26 ENCOUNTER — Encounter: Payer: Medicaid Other | Admitting: Obstetrics & Gynecology

## 2022-05-06 ENCOUNTER — Ambulatory Visit: Payer: Medicaid Other

## 2022-05-07 ENCOUNTER — Ambulatory Visit: Payer: Medicaid Other | Attending: Obstetrics | Admitting: *Deleted

## 2022-05-07 ENCOUNTER — Encounter: Payer: Self-pay | Admitting: *Deleted

## 2022-05-07 ENCOUNTER — Other Ambulatory Visit: Payer: Self-pay | Admitting: *Deleted

## 2022-05-07 ENCOUNTER — Ambulatory Visit (HOSPITAL_BASED_OUTPATIENT_CLINIC_OR_DEPARTMENT_OTHER): Payer: Medicaid Other

## 2022-05-07 VITALS — BP 131/56 | HR 98

## 2022-05-07 DIAGNOSIS — E669 Obesity, unspecified: Secondary | ICD-10-CM

## 2022-05-07 DIAGNOSIS — O99212 Obesity complicating pregnancy, second trimester: Secondary | ICD-10-CM | POA: Insufficient documentation

## 2022-05-07 DIAGNOSIS — O358XX0 Maternal care for other (suspected) fetal abnormality and damage, not applicable or unspecified: Secondary | ICD-10-CM

## 2022-05-07 DIAGNOSIS — Z3A27 27 weeks gestation of pregnancy: Secondary | ICD-10-CM | POA: Insufficient documentation

## 2022-05-07 DIAGNOSIS — Z6841 Body Mass Index (BMI) 40.0 and over, adult: Secondary | ICD-10-CM

## 2022-06-18 ENCOUNTER — Ambulatory Visit: Payer: Medicaid Other | Attending: Obstetrics

## 2022-06-18 ENCOUNTER — Ambulatory Visit: Payer: Medicaid Other | Admitting: *Deleted

## 2022-06-18 ENCOUNTER — Other Ambulatory Visit: Payer: Self-pay | Admitting: *Deleted

## 2022-06-18 VITALS — BP 133/71 | HR 103

## 2022-06-18 DIAGNOSIS — Z3689 Encounter for other specified antenatal screening: Secondary | ICD-10-CM

## 2022-06-18 DIAGNOSIS — Z3A33 33 weeks gestation of pregnancy: Secondary | ICD-10-CM | POA: Diagnosis not present

## 2022-06-18 DIAGNOSIS — Z6841 Body Mass Index (BMI) 40.0 and over, adult: Secondary | ICD-10-CM

## 2022-06-18 DIAGNOSIS — O99213 Obesity complicating pregnancy, third trimester: Secondary | ICD-10-CM | POA: Diagnosis not present

## 2022-06-18 DIAGNOSIS — E669 Obesity, unspecified: Secondary | ICD-10-CM | POA: Diagnosis not present

## 2022-06-25 ENCOUNTER — Encounter: Payer: Self-pay | Admitting: *Deleted

## 2022-06-25 ENCOUNTER — Ambulatory Visit: Payer: Medicaid Other | Attending: Obstetrics

## 2022-06-25 ENCOUNTER — Ambulatory Visit: Payer: Medicaid Other | Admitting: *Deleted

## 2022-06-25 VITALS — BP 119/77 | HR 105

## 2022-06-25 DIAGNOSIS — O99213 Obesity complicating pregnancy, third trimester: Secondary | ICD-10-CM | POA: Insufficient documentation

## 2022-06-25 DIAGNOSIS — Z6841 Body Mass Index (BMI) 40.0 and over, adult: Secondary | ICD-10-CM

## 2022-06-25 DIAGNOSIS — E669 Obesity, unspecified: Secondary | ICD-10-CM | POA: Diagnosis not present

## 2022-06-25 DIAGNOSIS — Z3A34 34 weeks gestation of pregnancy: Secondary | ICD-10-CM

## 2022-06-29 ENCOUNTER — Inpatient Hospital Stay (HOSPITAL_COMMUNITY)
Admission: AD | Admit: 2022-06-29 | Discharge: 2022-06-29 | Disposition: A | Discharge status: Home / Self Care | Payer: Medicaid Other | Attending: Obstetrics and Gynecology | Admitting: Obstetrics and Gynecology

## 2022-06-29 ENCOUNTER — Encounter (HOSPITAL_COMMUNITY): Payer: Self-pay | Admitting: Obstetrics and Gynecology

## 2022-06-29 DIAGNOSIS — O479 False labor, unspecified: Secondary | ICD-10-CM | POA: Diagnosis not present

## 2022-06-29 DIAGNOSIS — O2343 Unspecified infection of urinary tract in pregnancy, third trimester: Secondary | ICD-10-CM | POA: Diagnosis not present

## 2022-06-29 DIAGNOSIS — Z3A35 35 weeks gestation of pregnancy: Secondary | ICD-10-CM | POA: Diagnosis not present

## 2022-06-29 DIAGNOSIS — O26893 Other specified pregnancy related conditions, third trimester: Secondary | ICD-10-CM | POA: Diagnosis not present

## 2022-06-29 LAB — URINALYSIS, ROUTINE W REFLEX MICROSCOPIC
Bilirubin Urine: NEGATIVE
Glucose, UA: NEGATIVE mg/dL
Hgb urine dipstick: NEGATIVE
Ketones, ur: NEGATIVE mg/dL
Nitrite: NEGATIVE
Protein, ur: NEGATIVE mg/dL
Specific Gravity, Urine: 1.015 (ref 1.005–1.030)
WBC, UA: >50 WBC/hpf — ABNORMAL HIGH (ref 0–5)
pH: 6 (ref 5.0–8.0)

## 2022-06-29 MED ORDER — CEFADROXIL 500 MG PO CAPS: 500.0000 mg | ORAL_CAPSULE | Freq: Two times a day (BID) | ORAL | 0 refills | Status: AC

## 2022-06-29 NOTE — MAU Note (Signed)
.  Heather Ingram is a 21 y.o. at [redacted]w[redacted]d here in MAU reporting: about an hour ago she started having pressure in her sides an her back. Reports positive fetal movement. Denies bleeding or ROM Onset of complaint: one hour ago Pain score: 4/10 Vitals:   06/29/22 1917  BP: (!) 147/77  Pulse: (!) 122  Resp: 18  Temp: 98.6 F (37 C)  SpO2: 98%      Lab orders placed from triage:

## 2022-06-29 NOTE — MAU Provider Note (Signed)
History     [id number]  Arrival date and time: 06/29/22 3710    Chief Complaint  Patient presents with   Abdominal Pain   Back Pain     HPI Heather Ingram is a 21 y.o. at [redacted]w[redacted]d who presents for abdominal pain. Symptoms started an hour ago. Reports intermittent abdominal pain that radiates to her back. Pain feels like tightening & pressure. Also reports painful urinary since this started. Denies fever, n/v/d, constipation, vaginal bleeding, or LOF. Reports good fetal movement.  Denies history of hypertension, headache, visual disturbance, or epigastric pain.   OB History     Gravida  1   Para      Term      Preterm      AB      Living         SAB      IAB      Ectopic      Multiple      Live Births              Past Medical History:  Diagnosis Date   Medical history non-contributory     Past Surgical History:  Procedure Laterality Date   WISDOM TOOTH EXTRACTION      Family History  Problem Relation Age of Onset   Diabetes Maternal Aunt    Healthy Mother    Healthy Father     No Known Allergies  No current facility-administered medications on file prior to encounter.   Current Outpatient Medications on File Prior to Encounter  Medication Sig Dispense Refill   Prenatal Vit-Fe Fumarate-FA (PRENATAL MULTIVITAMIN) TABS tablet Take 1 tablet by mouth daily at 12 noon.     pantoprazole (PROTONIX) 40 MG tablet Take 40 mg by mouth daily.       ROS Pertinent positives and negative per HPI, all others reviewed and negative  Physical Exam   BP 130/60   Pulse (!) 106   Temp 98.6 F (37 C)   Resp 18   Ht 5\' 3"  (1.6 m)   Wt (!) 153.8 kg   LMP 10/25/2021 Comment: pt shielded.  SpO2 98%   BMI 60.07 kg/m   Patient Vitals for the past 24 hrs:  BP Temp Pulse Resp SpO2 Height Weight  06/29/22 2012 130/60 -- (!) 106 -- -- -- --  06/29/22 2010 -- -- -- -- 98 % -- --  06/29/22 2005 -- -- -- -- 93 % -- --  06/29/22 2000 -- -- -- -- 98  % -- --  06/29/22 1955 -- -- -- -- 98 % -- --  06/29/22 1950 -- -- -- -- 98 % -- --  06/29/22 1945 136/69 -- (!) 105 -- 98 % -- --  06/29/22 1940 -- -- -- -- 98 % -- --  06/29/22 1935 -- -- -- -- 98 % -- --  06/29/22 1931 137/62 -- (!) 107 -- -- -- --  06/29/22 1930 -- -- -- -- 98 % -- --  06/29/22 1925 -- -- -- -- 99 % -- --  06/29/22 1920 -- -- -- -- 98 % -- --  06/29/22 1918 -- -- (!) 113 -- -- -- --  06/29/22 1917 (!) 147/77 98.6 F (37 C) (!) 122 18 98 % 5\' 3"  (1.6 m) (!) 153.8 kg    Physical Exam Vitals and nursing note reviewed. Exam conducted with a chaperone present.  Constitutional:      Appearance: She is well-developed. She is obese. She is  not ill-appearing.  HENT:     Head: Normocephalic and atraumatic.  Pulmonary:     Effort: Pulmonary effort is normal. No respiratory distress.  Abdominal:     Palpations: Abdomen is soft.     Tenderness: There is no abdominal tenderness.  Skin:    General: Skin is warm and dry.  Neurological:     Mental Status: She is alert.      Cervical Exam Dilation: 1 Effacement (%): Thick Cervical Position: Anterior Station: Ballotable Exam by:: E.Quynh Basso,NP    FHT Baseline 155, moderate variability, 15x15 accels, no decels Toco: Q 5-9 minutes Cat: 1  Labs Results for orders placed or performed during the hospital encounter of 06/29/22 (from the past 24 hour(s))  Urinalysis, Routine w reflex microscopic Urine, Clean Catch     Status: Abnormal   Collection Time: 06/29/22  7:29 PM  Result Value Ref Range   Color, Urine YELLOW YELLOW   APPearance CLOUDY (A) CLEAR   Specific Gravity, Urine 1.015 1.005 - 1.030   pH 6.0 5.0 - 8.0   Glucose, UA NEGATIVE NEGATIVE mg/dL   Hgb urine dipstick NEGATIVE NEGATIVE   Bilirubin Urine NEGATIVE NEGATIVE   Ketones, ur NEGATIVE NEGATIVE mg/dL   Protein, ur NEGATIVE NEGATIVE mg/dL   Nitrite NEGATIVE NEGATIVE   Leukocytes,Ua LARGE (A) NEGATIVE   RBC / HPF 0-5 0 - 5 RBC/hpf   WBC, UA >50  (H) 0 - 5 WBC/hpf   Bacteria, UA RARE (A) NONE SEEN   Squamous Epithelial / LPF 11-20 0 - 5   Mucus PRESENT    Hyaline Casts, UA PRESENT    Non Squamous Epithelial 0-5 (A) NONE SEEN    Imaging No results found.  MAU Course  Procedures Lab Orders         Culture, OB Urine         Urinalysis, Routine w reflex microscopic Urine, Clean Catch    Meds ordered this encounter  Medications   cefadroxil (DURICEF) 500 MG capsule    Sig: Take 1 capsule (500 mg total) by mouth 2 (two) times daily for 7 days.    Dispense:  14 capsule    Refill:  0    Order Specific Question:   Supervising Provider    Answer:   Reva Bores [[id number]]   Imaging Orders  No imaging studies ordered today    MDM Intake BP elevated. All repeats normal. No s/s of preeclampsia.   Cervix 1/thick & unchanged while in MAU. Pain r/t braxton hicks vs probable UTI U/a with moderate leuks, bacteria, & WBCs. Patient reports pain with urination. No fever or CVAT. Will tx with antibiotics & send urine for culture Assessment and Plan   1. Braxton Hick's contraction  -Reviewed s/s of labor & reasons to return to MAU  2. UTI (urinary tract infection) during pregnancy, third trimester  -Rx duricef -Urine culture pending  3. [redacted] weeks gestation of pregnancy      Judeth Horn, NP 06/29/22 9:07 PM

## 2022-07-01 LAB — CULTURE, OB URINE

## 2022-07-02 ENCOUNTER — Encounter: Payer: Self-pay | Admitting: *Deleted

## 2022-07-02 ENCOUNTER — Ambulatory Visit: Payer: Medicaid Other | Attending: Obstetrics

## 2022-07-02 ENCOUNTER — Ambulatory Visit: Payer: Medicaid Other | Admitting: *Deleted

## 2022-07-02 DIAGNOSIS — Z6841 Body Mass Index (BMI) 40.0 and over, adult: Secondary | ICD-10-CM | POA: Insufficient documentation

## 2022-07-02 DIAGNOSIS — E669 Obesity, unspecified: Secondary | ICD-10-CM | POA: Diagnosis not present

## 2022-07-02 DIAGNOSIS — Z3A35 35 weeks gestation of pregnancy: Secondary | ICD-10-CM | POA: Insufficient documentation

## 2022-07-02 DIAGNOSIS — O99213 Obesity complicating pregnancy, third trimester: Secondary | ICD-10-CM | POA: Diagnosis present

## 2022-07-03 ENCOUNTER — Emergency Department (HOSPITAL_BASED_OUTPATIENT_CLINIC_OR_DEPARTMENT_OTHER)
Admission: EM | Admit: 2022-07-03 | Discharge: 2022-07-03 | Disposition: A | Discharge status: Home / Self Care | Payer: Medicaid Other | Attending: Emergency Medicine | Admitting: Emergency Medicine

## 2022-07-03 ENCOUNTER — Other Ambulatory Visit: Payer: Self-pay

## 2022-07-03 DIAGNOSIS — Z20822 Contact with and (suspected) exposure to covid-19: Secondary | ICD-10-CM | POA: Diagnosis not present

## 2022-07-03 DIAGNOSIS — J1089 Influenza due to other identified influenza virus with other manifestations: Secondary | ICD-10-CM | POA: Diagnosis not present

## 2022-07-03 DIAGNOSIS — J111 Influenza due to unidentified influenza virus with other respiratory manifestations: Secondary | ICD-10-CM

## 2022-07-03 DIAGNOSIS — J029 Acute pharyngitis, unspecified: Secondary | ICD-10-CM | POA: Diagnosis present

## 2022-07-03 LAB — SARS CORONAVIRUS 2 BY RT PCR: SARS Coronavirus 2 by RT PCR: NEGATIVE

## 2022-07-03 NOTE — ED Provider Notes (Signed)
[facility] EMERGENCY DEPARTMENT Provider Note   CSN: [id number] Arrival date & time: 07/03/22  1900     History  Chief Complaint  Patient presents with   Generalized Body Aches   Sore Throat    Heather Ingram is a 21 y.o. female.   Sore Throat   21 year old female presents emergency department with complaints of sore throat, generalized body aches.  Patient states that symptoms began insidiously last night.  She notes symptoms began when her fianc approximate the same time.  She presents with her fianc for testing of COVID.  She confirms her current pregnancy.  Denies fever, chills, night sweats, chest pain, shortness of breath, abdominal pain, nausea/vomiting from baseline pregnancy, urinary/vaginal symptoms, change in bowel habits.  No significant past medical history.  Patient currently pregnant.  Home Medications Prior to Admission medications   Medication Sig Start Date End Date Taking? Authorizing Provider  cefadroxil (DURICEF) 500 MG capsule Take 1 capsule (500 mg total) by mouth 2 (two) times daily for 7 days. 06/29/22 07/06/22  Judeth Horn, NP  pantoprazole (PROTONIX) 40 MG tablet Take 40 mg by mouth daily. 04/18/22   [provider]  Prenatal Vit-Fe Fumarate-FA (PRENATAL MULTIVITAMIN) TABS tablet Take 1 tablet by mouth daily at 12 noon.    [provider]      Allergies    Patient has no known allergies.    Review of Systems   Review of Systems  All other systems reviewed and are negative.   Physical Exam Updated Vital Signs BP (!) 151/104 (BP Location: Left Arm)   Pulse 88   Temp 99 F (37.2 C)   Resp 20   Ht 5\' 3"  (1.6 m)   Wt (!) 152.4 kg   LMP 10/25/2021 Comment: pt shielded.  SpO2 100%   BMI 59.52 kg/m  Physical Exam Vitals and nursing note reviewed.  Constitutional:      General: She is not in acute distress.    Appearance: She is well-developed.  HENT:     Head: Normocephalic and atraumatic.      Nose: No congestion.     Mouth/Throat:     Comments: Mild posterior pharyngeal erythema.  No obvious exudate noted.  Tonsils 1+ bilaterally with no exudate. Eyes:     Conjunctiva/sclera: Conjunctivae normal.  Cardiovascular:     Rate and Rhythm: Normal rate and regular rhythm.     Heart sounds: No murmur heard. Pulmonary:     Effort: Pulmonary effort is normal. No respiratory distress.     Breath sounds: Normal breath sounds. No wheezing or rales.  Abdominal:     Palpations: Abdomen is soft.     Tenderness: There is no abdominal tenderness.  Musculoskeletal:        General: No swelling.     Cervical back: Normal range of motion and neck supple. No rigidity or tenderness.     Right lower leg: No edema.     Left lower leg: No edema.  Skin:    General: Skin is warm and dry.     Capillary Refill: Capillary refill takes less than 2 seconds.  Neurological:     Mental Status: She is alert.  Psychiatric:        Mood and Affect: Mood normal.     ED Results / Procedures / Treatments   Labs (all labs ordered are listed, but only abnormal results are displayed) Labs Reviewed  SARS CORONAVIRUS 2 BY RT PCR    EKG None  Radiology [place] MFM FETAL BPP WO NON STRESS  Result Date: 07/02/2022 ----------------------------------------------------------------------  OBSTETRICS REPORT                       (Signed Final 07/02/2022 09:24 am) ---------------------------------------------------------------------- Patient Info  ID #:       [id number]                          D.O.B.:  [birth year] (21 yrs)  Name:       Heather Ingram             Visit Date: 07/02/2022 09:14 am              RAMOS ---------------------------------------------------------------------- Performed By  Attending:        Braxton Feathers DO       Ref. Address:     [address]                                                             [address]                                                              [place]                                                             [id number]  Performed By:     Cyndie Mull          [facility]  Referred By:      [facility] ---------------------------------------------------------------------- Orders  #  Description                           Code        Ordered By  1  [place] MFM FETAL BPP WO NON               76819.01    YU FANG     STRESS ----------------------------------------------------------------------  #  Order #                     Accession #                Episode #  1  [id number]                   [id number]                 [id number] ---------------------------------------------------------------------- Indications  [redacted] weeks gestation of pregnancy                Z3A.35  Obesity complicating pregnancy, third          O99.213  trimester (BMI 55)  LR NIPS/Neg AFP/Neg Horizon/ No GDM ---------------------------------------------------------------------- Vital Signs  BP:          135/77 ---------------------------------------------------------------------- Fetal Evaluation  Num Of Fetuses:         1  Fetal Heart Rate(bpm):  148  Cardiac Activity:       Observed  Presentation:           Cephalic  Placenta:               Posterior  P. Cord Insertion:      Not well visualized  Amniotic Fluid  AFI FV:      Within normal limits  AFI Sum(cm)     %Tile       Largest Pocket(cm)  16.06           59          5.67  RUQ(cm)       RLQ(cm)       LUQ(cm)        LLQ(cm)  4.69          4.21          5.67           1.49 ---------------------------------------------------------------------- Biophysical Evaluation  Amniotic F.V:   Pocket => 2 cm             F. Tone:        Observed  F. Movement:    Observed                    Score:          8/8  F. Breathing:   Observed ---------------------------------------------------------------------- OB History  Gravidity:    1 ---------------------------------------------------------------------- Gestational Age  LMP:           35w 5d        Date:  10/25/21                   EDD:   08/01/22  Best:          35w 5d     Det. By:  LMP  (10/25/21)          EDD:   08/01/22 ---------------------------------------------------------------------- Comments  The patient is here for  a BPP and growth [place] for maternal  obesity (BMI 55). She is at 35w 5d with EDD of 08/01/2022  dated by LMP  (10/25/21).  Sonographic findings  Single intrauterine pregnancy.  Observed fetal cardiac activity.  Cephalic presentation.  Interval fetal anatomy appears normal.  Amniotic fluid volume: Within normal limits. AFI: 16.06 cm.  MVP: 5.67 cm.  Placenta is Posterior.  BPP is 8/8.  Recommendations  1. BBPs weekly until delivery  2. Growth ultrasounds every 4 weeks until delivery  3. Delivery around 39-[redacted] weeks gestation ----------------------------------------------------------------------                  Braxton Feathers, DO Electronically Signed Final Report   07/02/2022 09:24 am ----------------------------------------------------------------------   Procedures Procedures    Medications Ordered in ED Medications - No data to display  ED Course/ Medical Decision Making/ A&P                           Medical Decision Making  This patient presents to the ED for concern of flulike symptoms, this involves an extensive number of treatment options, and is a complaint that carries with it a high risk of complications and morbidity.  The differential diagnosis includes COVID, influenza, URI, meningitis, pneumonia   Co morbidities that complicate the patient evaluation  See HPI   Additional history obtained:  Additional history obtained from EMR  Lab Tests:  I Ordered, and personally interpreted labs.  The  pertinent results include: Respiratory viral panel.  Patient negative for COVID.    Imaging Studies ordered:  Chest x-ray considered but deemed necessary due to no obvious wheeze, rhonchi, Rales auscultated on lung exam  Cardiac Monitoring: / EKG:  The patient was maintained on a cardiac monitor.  I personally viewed and interpreted the cardiac monitored which showed an underlying rhythm of: Sinus rhythm   Consultations Obtained:  N/a   Problem List / ED Course / Critical interventions / Medication management  Flulike symptoms  Reevaluation of the patient after these medicines showed that the patient improved I have reviewed the patients home medicines and have made adjustments as needed   Social Determinants of Health:  Denies tobacco, illicit drug use   Test / Admission - Considered:  Flulike symptoms Vitals signs significant for hypertension with a blood pressure 151/104.  Recommend close follow-up with PCP regarding elevation of blood pressure. Otherwise within normal range and stable throughout visit. Laboratory/imaging studies significant for: See above Patient symptoms likely secondary to viral URI given acute nature of symptom onset as well as fianc experiencing similar symptoms.  Coronavirus negative.  Patient recommended symptomatic therapy with Tylenol as needed for pain/fever.  Recommended continued adequate oral rehydration as well as food intake.  Close follow-up with PCP recommended for reevaluation in 3 to 5 days.  Treatment plan was discussed at length with patient he knowledge understanding was agreeable to said plan. Worrisome signs and symptoms were discussed with the patient, and the patient acknowledged understanding to return to the ED if noticed. Patient was stable upon discharge.         Final Clinical Impression(s) / ED Diagnoses Final diagnoses:  Influenza-like illness    Rx / DC Orders ED Discharge Orders     None         Peter Garter, [place] 07/03/22 2149    Rondel Baton, MD 07/04/22 1223

## 2022-07-03 NOTE — ED Triage Notes (Signed)
C/O sore throat and body aches since yesterday;

## 2022-07-03 NOTE — Discharge Instructions (Signed)
Note your work-up today was overall reassuring.  Your COVID test was negative.  Continue taking Tylenol as needed for pain/fever.  Recommend close follow-up with PCP in 3 to 5 days for reevaluation of symptoms.  Please do not hesitate to return to the emergency department the worrisome signs and symptoms we discussed become apparent.

## 2022-07-04 LAB — OB RESULTS CONSOLE GBS: GBS: POSITIVE

## 2022-07-05 ENCOUNTER — Other Ambulatory Visit: Payer: Self-pay | Admitting: Obstetrics and Gynecology

## 2022-07-09 ENCOUNTER — Ambulatory Visit: Payer: Medicaid Other | Admitting: *Deleted

## 2022-07-09 ENCOUNTER — Ambulatory Visit: Payer: Medicaid Other | Attending: Obstetrics

## 2022-07-09 ENCOUNTER — Encounter: Payer: Self-pay | Admitting: *Deleted

## 2022-07-09 DIAGNOSIS — Z6841 Body Mass Index (BMI) 40.0 and over, adult: Secondary | ICD-10-CM | POA: Insufficient documentation

## 2022-07-09 DIAGNOSIS — O99213 Obesity complicating pregnancy, third trimester: Secondary | ICD-10-CM | POA: Insufficient documentation

## 2022-07-09 DIAGNOSIS — Z3A36 36 weeks gestation of pregnancy: Secondary | ICD-10-CM | POA: Diagnosis not present

## 2022-07-09 DIAGNOSIS — E669 Obesity, unspecified: Secondary | ICD-10-CM

## 2022-07-16 ENCOUNTER — Ambulatory Visit: Payer: Medicaid Other | Attending: Obstetrics

## 2022-07-16 ENCOUNTER — Ambulatory Visit: Payer: Medicaid Other | Admitting: *Deleted

## 2022-07-16 ENCOUNTER — Other Ambulatory Visit: Payer: Self-pay | Admitting: Obstetrics

## 2022-07-16 ENCOUNTER — Other Ambulatory Visit: Payer: Self-pay | Admitting: *Deleted

## 2022-07-16 VITALS — BP 130/69 | HR 93

## 2022-07-16 DIAGNOSIS — O99213 Obesity complicating pregnancy, third trimester: Secondary | ICD-10-CM | POA: Insufficient documentation

## 2022-07-16 DIAGNOSIS — E669 Obesity, unspecified: Secondary | ICD-10-CM

## 2022-07-16 DIAGNOSIS — Z6841 Body Mass Index (BMI) 40.0 and over, adult: Secondary | ICD-10-CM | POA: Insufficient documentation

## 2022-07-16 DIAGNOSIS — Z3A37 37 weeks gestation of pregnancy: Secondary | ICD-10-CM | POA: Insufficient documentation

## 2022-07-18 ENCOUNTER — Telehealth (HOSPITAL_COMMUNITY): Payer: Self-pay | Admitting: *Deleted

## 2022-07-19 ENCOUNTER — Encounter (HOSPITAL_COMMUNITY): Payer: Self-pay | Admitting: *Deleted

## 2022-07-19 NOTE — Telephone Encounter (Signed)
Preadmission screen  

## 2022-07-23 ENCOUNTER — Ambulatory Visit (HOSPITAL_BASED_OUTPATIENT_CLINIC_OR_DEPARTMENT_OTHER): Payer: Medicaid Other

## 2022-07-23 ENCOUNTER — Other Ambulatory Visit: Payer: Self-pay

## 2022-07-23 ENCOUNTER — Encounter (HOSPITAL_COMMUNITY): Payer: Self-pay | Admitting: Obstetrics and Gynecology

## 2022-07-23 ENCOUNTER — Inpatient Hospital Stay (HOSPITAL_COMMUNITY)
Admission: AD | Admit: 2022-07-23 | Discharge: 2022-07-26 | DRG: 807 | Disposition: A | Discharge status: Home / Self Care | Payer: Medicaid Other | Attending: Obstetrics and Gynecology | Admitting: Obstetrics and Gynecology

## 2022-07-23 ENCOUNTER — Ambulatory Visit: Payer: Medicaid Other | Admitting: *Deleted

## 2022-07-23 ENCOUNTER — Ambulatory Visit: Payer: Medicaid Other | Attending: Maternal & Fetal Medicine | Admitting: Maternal & Fetal Medicine

## 2022-07-23 DIAGNOSIS — O99213 Obesity complicating pregnancy, third trimester: Secondary | ICD-10-CM

## 2022-07-23 DIAGNOSIS — O1493 Unspecified pre-eclampsia, third trimester: Secondary | ICD-10-CM | POA: Diagnosis present

## 2022-07-23 DIAGNOSIS — O99214 Obesity complicating childbirth: Secondary | ICD-10-CM | POA: Diagnosis present

## 2022-07-23 DIAGNOSIS — E669 Obesity, unspecified: Secondary | ICD-10-CM

## 2022-07-23 DIAGNOSIS — Z3A38 38 weeks gestation of pregnancy: Secondary | ICD-10-CM

## 2022-07-23 DIAGNOSIS — O133 Gestational [pregnancy-induced] hypertension without significant proteinuria, third trimester: Secondary | ICD-10-CM | POA: Diagnosis not present

## 2022-07-23 DIAGNOSIS — O99824 Streptococcus B carrier state complicating childbirth: Secondary | ICD-10-CM | POA: Diagnosis present

## 2022-07-23 DIAGNOSIS — O1404 Mild to moderate pre-eclampsia, complicating childbirth: Secondary | ICD-10-CM | POA: Diagnosis present

## 2022-07-23 DIAGNOSIS — Z6841 Body Mass Index (BMI) 40.0 and over, adult: Secondary | ICD-10-CM | POA: Diagnosis not present

## 2022-07-23 HISTORY — DX: Unspecified pre-eclampsia, third trimester: O14.93

## 2022-07-23 LAB — URINALYSIS, ROUTINE W REFLEX MICROSCOPIC
Bilirubin Urine: NEGATIVE
Glucose, UA: NEGATIVE mg/dL
Ketones, ur: NEGATIVE mg/dL
Nitrite: NEGATIVE
Protein, ur: 100 mg/dL — AB
Specific Gravity, Urine: 1.021 (ref 1.005–1.030)
Squamous Epithelial / HPF: >50 — ABNORMAL HIGH (ref 0–5)
WBC, UA: >50 WBC/hpf — ABNORMAL HIGH (ref 0–5)
pH: 5 (ref 5.0–8.0)

## 2022-07-23 LAB — CBC
HCT: 34.2 % — ABNORMAL LOW (ref 36.0–46.0)
Hemoglobin: 11.1 g/dL — ABNORMAL LOW (ref 12.0–15.0)
MCH: 24.5 pg — ABNORMAL LOW (ref 26.0–34.0)
MCHC: 32.5 g/dL (ref 30.0–36.0)
MCV: 75.5 fL — ABNORMAL LOW (ref 80.0–100.0)
Platelets: 345 10*3/uL (ref 150–400)
RBC: 4.53 MIL/uL (ref 3.87–5.11)
RDW: 14.4 % (ref 11.5–15.5)
WBC: 7.6 10*3/uL (ref 4.0–10.5)
nRBC: 0 % (ref 0.0–0.2)

## 2022-07-23 LAB — COMPREHENSIVE METABOLIC PANEL
ALT: 16 U/L (ref 0–44)
AST: 19 U/L (ref 15–41)
Albumin: 2.5 g/dL — ABNORMAL LOW (ref 3.5–5.0)
Alkaline Phosphatase: 157 U/L — ABNORMAL HIGH (ref 38–126)
Anion gap: 8 (ref 5–15)
BUN: 10 mg/dL (ref 6–20)
CO2: 23 mmol/L (ref 22–32)
Calcium: 8.9 mg/dL (ref 8.9–10.3)
Chloride: 106 mmol/L (ref 98–111)
Creatinine, Ser: 0.76 mg/dL (ref 0.44–1.00)
GFR, Estimated: >60 mL/min (ref >60)
Glucose, Bld: 87 mg/dL (ref 70–99)
Potassium: 4 mmol/L (ref 3.5–5.1)
Sodium: 137 mmol/L (ref 135–145)
Total Bilirubin: 0.1 mg/dL — ABNORMAL LOW (ref 0.3–1.2)
Total Protein: 6.3 g/dL — ABNORMAL LOW (ref 6.5–8.1)

## 2022-07-23 LAB — TYPE AND SCREEN
ABO/RH(D): B POS
Antibody Screen: NEGATIVE

## 2022-07-23 LAB — PROTEIN / CREATININE RATIO, URINE
Creatinine, Urine: 218 mg/dL
Protein Creatinine Ratio: 0.4 mg/mg{Cre} — ABNORMAL HIGH (ref 0.00–0.15)
Total Protein, Urine: 88 mg/dL

## 2022-07-23 MED ORDER — OXYTOCIN BOLUS FROM INFUSION
333.0000 mL | Freq: Once | INTRAVENOUS | Status: AC
  Administered 2022-07-24: 333 mL via INTRAVENOUS

## 2022-07-23 MED ORDER — MISOPROSTOL 25 MCG QUARTER TABLET: 25.0000 ug | ORAL_TABLET | ORAL | Status: DC

## 2022-07-23 MED ORDER — LACTATED RINGERS IV SOLN: 500.0000 mL | INTRAVENOUS | Status: DC | PRN

## 2022-07-23 MED ORDER — SOD CITRATE-CITRIC ACID 500-334 MG/5ML PO SOLN
30.0000 mL | ORAL | Status: DC | PRN
  Administered 2022-07-23: 30 mL via ORAL
  Filled 2022-07-23: qty 30

## 2022-07-23 MED ORDER — PENICILLIN G POT IN DEXTROSE 60000 UNIT/ML IV SOLN
3.0000 10*6.[IU] | INTRAVENOUS | Status: DC
  Administered 2022-07-23 – 2022-07-24 (×5): 3 10*6.[IU] via INTRAVENOUS
  Filled 2022-07-23 (×5): qty 50

## 2022-07-23 MED ORDER — OXYTOCIN-SODIUM CHLORIDE 30-0.9 UT/500ML-% IV SOLN
2.5000 [IU]/h | INTRAVENOUS | Status: DC
  Administered 2022-07-24: 2.5 [IU]/h via INTRAVENOUS

## 2022-07-23 MED ORDER — ONDANSETRON HCL 4 MG/2ML IJ SOLN
4.0000 mg | Freq: Four times a day (QID) | INTRAMUSCULAR | Status: DC | PRN
  Administered 2022-07-24: 4 mg via INTRAVENOUS
  Filled 2022-07-23: qty 2

## 2022-07-23 MED ORDER — LIDOCAINE HCL (PF) 1 % IJ SOLN: 30.0000 mL | INTRAMUSCULAR | Status: DC | PRN

## 2022-07-23 MED ORDER — OXYCODONE-ACETAMINOPHEN 5-325 MG PO TABS: 2.0000 | ORAL_TABLET | ORAL | Status: DC | PRN

## 2022-07-23 MED ORDER — LACTATED RINGERS IV SOLN: INTRAVENOUS | Status: DC

## 2022-07-23 MED ORDER — MISOPROSTOL 25 MCG QUARTER TABLET
25.0000 ug | ORAL_TABLET | ORAL | Status: DC
  Administered 2022-07-23 – 2022-07-24 (×3): 25 ug via VAGINAL
  Filled 2022-07-23 (×3): qty 1

## 2022-07-23 MED ORDER — FENTANYL CITRATE (PF) 100 MCG/2ML IJ SOLN
50.0000 ug | INTRAMUSCULAR | Status: DC | PRN
  Administered 2022-07-24 (×3): 100 ug via INTRAVENOUS
  Filled 2022-07-23 (×3): qty 2

## 2022-07-23 MED ORDER — ACETAMINOPHEN 325 MG PO TABS: 650.0000 mg | ORAL_TABLET | ORAL | Status: DC | PRN

## 2022-07-23 MED ORDER — OXYCODONE-ACETAMINOPHEN 5-325 MG PO TABS: 1.0000 | ORAL_TABLET | ORAL | Status: DC | PRN

## 2022-07-23 MED ORDER — SODIUM CHLORIDE 0.9 % IV SOLN
5.0000 10*6.[IU] | Freq: Once | INTRAVENOUS | Status: AC
  Administered 2022-07-23: 5 10*6.[IU] via INTRAVENOUS
  Filled 2022-07-23: qty 5

## 2022-07-23 NOTE — H&P (Signed)
Heather Ingram is a 21 y.o. female presenting for IOL for preeclampsia without severe features.  Denies HA, RUQ pain or epigastric pian. OB History     Gravida  1   Para      Term      Preterm      AB      Living         SAB      IAB      Ectopic      Multiple      Live Births             Past Medical History:  Diagnosis Date   Medical history non-contributory    Past Surgical History:  Procedure Laterality Date   WISDOM TOOTH EXTRACTION     Family History: family history includes Diabetes in her maternal aunt; Healthy in her father and mother. Social History:  reports that she has never smoked. She has never used smokeless tobacco. She reports that she does not drink alcohol and does not use drugs.  Pregnancy complicted BY ASCUS pap BMI 60   Maternal Diabetes: No Genetic Screening: Normal Maternal Ultrasounds/Referrals: Normal Fetal Ultrasounds or other Referrals:  None Maternal Substance Abuse:  No Significant Maternal Medications:  None Significant Maternal Lab Results:  Group B Strep positive Number of Prenatal Visits:Less than or equal to 3 verified prenatal visits, greater than 3 verified prenatal visits Other Comments:  None  Review of Systems History Dilation: 1 Effacement (%): Thick Station: -3 Exam by:: Dr. Charlesetta Garibaldi Blood pressure (!) 131/91, pulse 97, temperature 98.4 F (36.9 C), temperature source Oral, resp. rate 16, height 5\' 2"  (1.575 m), weight (!) 160.6 kg, last menstrual period 10/25/2021, SpO2 97 %. Exam Physical Exam  Physical Examination: General appearance - alert, well appearing, and in no distress Chest - clear to auscultation, no wheezes, rales or rhonchi, symmetric air entry Heart - normal rate and regular rhythm Abdomen - soft, nontender, nondistended, no masses or organomegaly gravid Extremities - peripheral pulses normal, no pedal edema, no clubbing or cyanosis  Prenatal labs: ABO, Rh: --/--/PENDING  (10/03 1415) Antibody: PENDING (10/03 1415) Rubella: 1.37 (03/17 1043) RPR: Non Reactive (03/17 1043)  HBsAg: Negative (03/17 1043)  HIV: Non Reactive (03/17 1043)  GBS: Positive/-- (09/14 0000)   Assessment/Plan: Preeclampsia without severe features Labs are stable PCR 0.4 PCN for GBS prophylaxsis Cytotec number one placed Cat 1 NST Pt agrees with plan of care  Obesity pt states last EFW two weeks ago 7-12  Heather Ingram 07/23/2022, 4:48 PM

## 2022-07-23 NOTE — MAU Provider Note (Signed)
History     CSN: [id number]  Arrival date and time: 07/23/22 1217   Event Date/Time   First Provider Initiated Contact with Patient 07/23/22 1326      Chief Complaint  Patient presents with   BP Evaluation   HPI Ms. Heather Ingram is a 21 y.o. year old G1P0 female at [redacted]w[redacted]d weeks gestation who was sent to MAU from MFM for elevated BPs. She reports having a H/A earlier this morning, but none now. She does, however, report "seeing spots" intermittently.  She denies epigastric pain, VB or LOF. She reports (+) FM. She receives [facility] with [facility] .  OB History     Gravida  1   Para      Term      Preterm      AB      Living         SAB      IAB      Ectopic      Multiple      Live Births              Past Medical History:  Diagnosis Date   Medical history non-contributory     Past Surgical History:  Procedure Laterality Date   WISDOM TOOTH EXTRACTION      Family History  Problem Relation Age of Onset   Diabetes Maternal Aunt    Healthy Mother    Healthy Father     Social History   Tobacco Use   Smoking status: Never   Smokeless tobacco: Never  Vaping Use   Vaping Use: Former   Start date: 10/21/2018   Quit date: 12/09/2021   Substances: Nicotine  Substance Use Topics   Alcohol use: Never   Drug use: Never    Allergies: No Known Allergies  Medications Prior to Admission  Medication Sig Dispense Refill Last Dose   Prenatal Vit-Fe Fumarate-FA (PRENATAL MULTIVITAMIN) TABS tablet Take 1 tablet by mouth daily at 12 noon.   07/23/2022    Review of Systems  Constitutional: Negative.   HENT: Negative.    Eyes: Negative.   Cardiovascular:  Positive for leg swelling.  Gastrointestinal: Negative.   Endocrine: Negative.   Genitourinary: Negative.   Musculoskeletal: Negative.   Skin: Negative.   Allergic/Immunologic: Negative.   Neurological: Negative.   Hematological: Negative.   Psychiatric/Behavioral: Negative.      Physical Exam   Patient Vitals for the past 24 hrs:  BP Temp Temp src Pulse Resp SpO2 Height Weight  07/23/22 1330 (!) 147/82 -- -- (!) 108 -- 99 % -- --  07/23/22 1315 (!) 140/73 -- -- (!) 106 -- 98 % -- --  07/23/22 1305 104/67 -- -- (!) 119 -- 98 % -- --  07/23/22 1239 (!) 152/81 98.1 F (36.7 C) Oral (!) 115 18 100 % -- --  07/23/22 1236 -- -- -- -- -- -- 5\' 2"  (1.575 m) (!) 160.7 kg    Physical Exam Vitals and nursing note reviewed.  Constitutional:      Appearance: Normal appearance. She is obese.  Cardiovascular:     Rate and Rhythm: Tachycardia present.     Pulses: Normal pulses.     Heart sounds: Normal heart sounds.  Pulmonary:     Effort: Pulmonary effort is normal.     Breath sounds: Normal breath sounds.  Abdominal:     General: Bowel sounds are normal.     Palpations: Abdomen is soft.  Genitourinary:  Comments: deferred Musculoskeletal:        General: Swelling (trace) present.  Skin:    General: Skin is warm and dry.  Neurological:     Mental Status: She is alert and oriented to person, place, and time.  Psychiatric:        Mood and Affect: Mood normal.        Behavior: Behavior normal.        Thought Content: Thought content normal.        Judgment: Judgment normal.    REACTIVE NST - FHR: 130 bpm / moderate variability / accels present / decels absent / TOCO: regular every 4-6 mins  MAU Course  Procedures  MDM CCUA CBC CMP P/C Ratio Serial BP's   Results for orders placed or performed during the hospital encounter of 07/23/22 (from the past 24 hour(s))  Urinalysis, Routine w reflex microscopic Urine, Clean Catch     Status: Abnormal   Collection Time: 07/23/22  1:00 PM  Result Value Ref Range   Color, Urine AMBER (A) YELLOW   APPearance CLOUDY (A) CLEAR   Specific Gravity, Urine 1.021 1.005 - 1.030   pH 5.0 5.0 - 8.0   Glucose, UA NEGATIVE NEGATIVE mg/dL   Hgb urine dipstick SMALL (A) NEGATIVE   Bilirubin Urine NEGATIVE NEGATIVE    Ketones, ur NEGATIVE NEGATIVE mg/dL   Protein, ur 100 (A) NEGATIVE mg/dL   Nitrite NEGATIVE NEGATIVE   Leukocytes,Ua LARGE (A) NEGATIVE   RBC / HPF 6-10 0 - 5 RBC/hpf   WBC, UA >50 (H) 0 - 5 WBC/hpf   Bacteria, UA FEW (A) NONE SEEN   Squamous Epithelial / LPF >50 (H) 0 - 5   Mucus PRESENT    Hyaline Casts, UA PRESENT      *TC to Dr. Charlesetta Garibaldi @ 1332 - notified of patient's complaints, assessments, elevated BP on 06/29/22 MAU visit and today in the office, and pending PEC labs, tx plan admit for IOL d/t gHTN - also notified of patient's plan for nitrous oxide for pain management only - agrees with plan, Dr. Charlesetta Garibaldi on the way to hospital and will complete admission  Assessment and Plan  1. Gestational hypertension, third trimester - Admit to L&D for IOL - Order to transfer patient to L&D  2. [redacted] weeks gestation of pregnancy    Laury Deep, [place] 07/23/2022, 1:26 PM

## 2022-07-23 NOTE — Progress Notes (Signed)
MFM Consult Note Patient Name: Heather Ingram  Patient MRN:   [id number]  Referring provider: [place]  Reason for Consult: Hypertension   HPI: Heather Ingram is a 21 y.o. G1 at 38w 5d with EDD of 08/01/2022 dated by LMP  (10/25/21). She is here for a BPP for maternal obesity.   Today her BP was 144/90 with a repeat of 147/84. She denies headache, vision disturbances, or RUQ pain. I discussed the possibility of gestational hypertension vs preeclampsia and the need for delivery if her blood pressure is still elevated when she arrives at the MAU. I discussed the maternal and fetal implications of hypertensive disorders of pregnancy including stroke, MI, abruption, maternal and fetal death. She verbalized understanding and will go promply to the MAU.  Sonographic findings Single intrauterine pregnancy. Observed fetal cardiac activity. Cephalic presentation. Interval fetal anatomy appears normal.  Fetal biometry shows the estimated fetal weight at the  percentile.  Amniotic fluid volume: Polyhydramnios. AFI: 22.4 cm.  MVP: 8.1 cm. Placenta is Posterior. BPP is 8/8.   Review of Systems: A review of systems was performed and was negative except per HPI   Vitals and Physical Exam See intake sheet for vitals Sitting comfortably on the sonogram table Nonlabored breathing Normal rate and rhythm Abdomen is non-tender  Recommendations - Sent the patient to the MAU for blood pressure assessment and possible delivery. If her BP is elevated in MAU then delivery would be indicated.  - I have notified the MAU and the OB provider Dr. Charlesetta Garibaldi  I spent 30 minutes reviewing the patients chart, including labs and images as well as counseling the patient about her medical conditions.  Valeda Malm  MFM, [place]   07/23/2022  10:49 AM

## 2022-07-23 NOTE — MAU Note (Addendum)
Heather Ingram is a 21 y.o. at [redacted]w[redacted]d here in MAU reporting: sent from [facility] office for BP evaluation.  Denies H/A currently, but has one earlier this morning.  Endorses visual disturbances, states intermittently seeing spots.  Denies epigastric pain.  Endorses +FM. Denies VB or LOF.  Reports +FM. LMP: N/A Onset of complaint: today Pain score: 0 Vitals:   07/23/22 1239  BP: (!) 152/81  Pulse: (!) 115  Resp: 18  Temp: 98.1 F (36.7 C)  SpO2: 100%     FHT: 154 bpm Lab orders placed from triage:   UA

## 2022-07-23 NOTE — Progress Notes (Addendum)
VSS last BP 137/89 Cat 1 tracing remotely reviewed by me Ctxs q1-79min GBS + on PCN Good UOP 100-150cc/hr S/p cytotec #2 at [facility]

## 2022-07-24 ENCOUNTER — Inpatient Hospital Stay (HOSPITAL_COMMUNITY): Payer: Medicaid Other | Admitting: Anesthesiology

## 2022-07-24 ENCOUNTER — Encounter (HOSPITAL_COMMUNITY): Payer: Self-pay | Admitting: Obstetrics and Gynecology

## 2022-07-24 LAB — CBC
HCT: 34.8 % — ABNORMAL LOW (ref 36.0–46.0)
HCT: 32.5 % — ABNORMAL LOW (ref 36.0–46.0)
Hemoglobin: 11.5 g/dL — ABNORMAL LOW (ref 12.0–15.0)
Hemoglobin: 10.7 g/dL — ABNORMAL LOW (ref 12.0–15.0)
MCH: 24.6 pg — ABNORMAL LOW (ref 26.0–34.0)
MCH: 24.8 pg — ABNORMAL LOW (ref 26.0–34.0)
MCHC: 33 g/dL (ref 30.0–36.0)
MCHC: 32.9 g/dL (ref 30.0–36.0)
MCV: 74.5 fL — ABNORMAL LOW (ref 80.0–100.0)
MCV: 75.4 fL — ABNORMAL LOW (ref 80.0–100.0)
Platelets: 324 10*3/uL (ref 150–400)
Platelets: 279 10*3/uL (ref 150–400)
RBC: 4.67 MIL/uL (ref 3.87–5.11)
RBC: 4.31 MIL/uL (ref 3.87–5.11)
RDW: 14.6 % (ref 11.5–15.5)
RDW: 14.5 % (ref 11.5–15.5)
WBC: 9.7 10*3/uL (ref 4.0–10.5)
WBC: 14.9 10*3/uL — ABNORMAL HIGH (ref 4.0–10.5)
nRBC: 0 % (ref 0.0–0.2)
nRBC: 0 % (ref 0.0–0.2)

## 2022-07-24 LAB — COMPREHENSIVE METABOLIC PANEL
ALT: 17 U/L (ref 0–44)
AST: 20 U/L (ref 15–41)
Albumin: 2.5 g/dL — ABNORMAL LOW (ref 3.5–5.0)
Alkaline Phosphatase: 160 U/L — ABNORMAL HIGH (ref 38–126)
Anion gap: 10 (ref 5–15)
BUN: 13 mg/dL (ref 6–20)
CO2: 21 mmol/L — ABNORMAL LOW (ref 22–32)
Calcium: 9.1 mg/dL (ref 8.9–10.3)
Chloride: 105 mmol/L (ref 98–111)
Creatinine, Ser: 0.67 mg/dL (ref 0.44–1.00)
GFR, Estimated: >60 mL/min (ref >60)
Glucose, Bld: 96 mg/dL (ref 70–99)
Potassium: 4.1 mmol/L (ref 3.5–5.1)
Sodium: 136 mmol/L (ref 135–145)
Total Bilirubin: 0.3 mg/dL (ref 0.3–1.2)
Total Protein: 6.2 g/dL — ABNORMAL LOW (ref 6.5–8.1)

## 2022-07-24 LAB — RPR: RPR Ser Ql: NONREACTIVE

## 2022-07-24 MED ORDER — OXYCODONE HCL 5 MG PO TABS: 10.0000 mg | ORAL_TABLET | ORAL | Status: DC | PRN

## 2022-07-24 MED ORDER — FENTANYL-BUPIVACAINE-NACL 0.5-0.125-0.9 MG/250ML-% EP SOLN
12.0000 mL/h | EPIDURAL | Status: DC | PRN
  Administered 2022-07-24: 12 mL/h via EPIDURAL
  Filled 2022-07-24: qty 250

## 2022-07-24 MED ORDER — TERBUTALINE SULFATE 1 MG/ML IJ SOLN: 0.2500 mg | Freq: Once | INTRAMUSCULAR | Status: DC | PRN

## 2022-07-24 MED ORDER — DIPHENHYDRAMINE HCL 25 MG PO CAPS: 25.0000 mg | ORAL_CAPSULE | Freq: Four times a day (QID) | ORAL | Status: DC | PRN

## 2022-07-24 MED ORDER — DIPHENHYDRAMINE HCL 50 MG/ML IJ SOLN: 12.5000 mg | INTRAMUSCULAR | Status: DC | PRN

## 2022-07-24 MED ORDER — IBUPROFEN 600 MG PO TABS
600.0000 mg | ORAL_TABLET | Freq: Four times a day (QID) | ORAL | Status: DC
  Administered 2022-07-24 – 2022-07-26 (×8): 600 mg via ORAL
  Filled 2022-07-24 (×8): qty 1

## 2022-07-24 MED ORDER — ZOLPIDEM TARTRATE 5 MG PO TABS: 5.0000 mg | ORAL_TABLET | Freq: Every evening | ORAL | Status: DC | PRN

## 2022-07-24 MED ORDER — WITCH HAZEL-GLYCERIN EX PADS: 1.0000 | MEDICATED_PAD | CUTANEOUS | Status: DC | PRN

## 2022-07-24 MED ORDER — PRENATAL MULTIVITAMIN CH
1.0000 | ORAL_TABLET | Freq: Every day | ORAL | Status: DC
  Administered 2022-07-25 – 2022-07-26 (×2): 1 via ORAL
  Filled 2022-07-24 (×2): qty 1

## 2022-07-24 MED ORDER — PHENYLEPHRINE 80 MCG/ML (10ML) SYRINGE FOR IV PUSH (FOR BLOOD PRESSURE SUPPORT): 80.0000 ug | PREFILLED_SYRINGE | INTRAVENOUS | Status: DC | PRN

## 2022-07-24 MED ORDER — OXYTOCIN-SODIUM CHLORIDE 30-0.9 UT/500ML-% IV SOLN
1.0000 m[IU]/min | INTRAVENOUS | Status: DC
  Administered 2022-07-24: 1 m[IU]/min via INTRAVENOUS
  Filled 2022-07-24: qty 500

## 2022-07-24 MED ORDER — BENZOCAINE-MENTHOL 20-0.5 % EX AERO
1.0000 | INHALATION_SPRAY | CUTANEOUS | Status: DC | PRN
  Filled 2022-07-24: qty 56

## 2022-07-24 MED ORDER — EPHEDRINE 5 MG/ML INJ: 10.0000 mg | INTRAVENOUS | Status: DC | PRN

## 2022-07-24 MED ORDER — DIBUCAINE (PERIANAL) 1 % EX OINT: 1.0000 | TOPICAL_OINTMENT | CUTANEOUS | Status: DC | PRN

## 2022-07-24 MED ORDER — ACETAMINOPHEN 325 MG PO TABS
650.0000 mg | ORAL_TABLET | ORAL | Status: DC | PRN
  Administered 2022-07-24 – 2022-07-25 (×2): 650 mg via ORAL
  Filled 2022-07-24 (×2): qty 2

## 2022-07-24 MED ORDER — OXYCODONE HCL 5 MG PO TABS: 5.0000 mg | ORAL_TABLET | ORAL | Status: DC | PRN

## 2022-07-24 MED ORDER — SIMETHICONE 80 MG PO CHEW: 80.0000 mg | CHEWABLE_TABLET | ORAL | Status: DC | PRN

## 2022-07-24 MED ORDER — LACTATED RINGERS IV SOLN: 500.0000 mL | Freq: Once | INTRAVENOUS | Status: DC

## 2022-07-24 MED ORDER — COCONUT OIL OIL: 1.0000 | TOPICAL_OIL | Status: DC | PRN

## 2022-07-24 MED ORDER — TETANUS-DIPHTH-ACELL PERTUSSIS 5-2.5-18.5 LF-MCG/0.5 IM SUSY: 0.5000 mL | PREFILLED_SYRINGE | Freq: Once | INTRAMUSCULAR | Status: DC

## 2022-07-24 MED ORDER — LIDOCAINE HCL (PF) 1 % IJ SOLN
INTRAMUSCULAR | Status: DC | PRN
  Administered 2022-07-24 (×2): 4 mL via EPIDURAL

## 2022-07-24 MED ORDER — TRANEXAMIC ACID-NACL 1000-0.7 MG/100ML-% IV SOLN
1000.0000 mg | INTRAVENOUS | Status: AC
  Administered 2022-07-24: 1000 mg via INTRAVENOUS

## 2022-07-24 MED ORDER — ONDANSETRON HCL 4 MG/2ML IJ SOLN: 4.0000 mg | INTRAMUSCULAR | Status: DC | PRN

## 2022-07-24 MED ORDER — MISOPROSTOL 200 MCG PO TABS
ORAL_TABLET | ORAL | Status: AC
  Filled 2022-07-24: qty 4

## 2022-07-24 MED ORDER — ONDANSETRON HCL 4 MG PO TABS: 4.0000 mg | ORAL_TABLET | ORAL | Status: DC | PRN

## 2022-07-24 MED ORDER — TRANEXAMIC ACID-NACL 1000-0.7 MG/100ML-% IV SOLN
INTRAVENOUS | Status: AC
  Filled 2022-07-24: qty 100

## 2022-07-24 MED ORDER — OXYTOCIN-SODIUM CHLORIDE 30-0.9 UT/500ML-% IV SOLN: 2.5000 [IU]/h | INTRAVENOUS | Status: DC | PRN

## 2022-07-24 MED ORDER — MISOPROSTOL 200 MCG PO TABS
800.0000 ug | ORAL_TABLET | Freq: Once | ORAL | Status: AC
  Administered 2022-07-24: 800 ug via RECTAL

## 2022-07-24 MED ORDER — SENNOSIDES-DOCUSATE SODIUM 8.6-50 MG PO TABS
2.0000 | ORAL_TABLET | Freq: Every day | ORAL | Status: DC
  Administered 2022-07-25 – 2022-07-26 (×2): 2 via ORAL
  Filled 2022-07-24 (×2): qty 2

## 2022-07-24 NOTE — Progress Notes (Signed)
Mckynzi A Shatonya Passon is a 21 y.o. G1P0 at [redacted]w[redacted]d admitted for IOL d/t Pre-Eclampsia without severe features  Subjective: No complaints except LOF.  Denies HA, visual changes or abdominal pain.  Objective: BP (!) 152/82   Pulse 89   Temp 98.3 F (36.8 C) (Oral)   Resp 16   Ht 5\' 2"  (1.575 m)   Wt (!) 160.6 kg   LMP 10/25/2021 Comment: pt shielded.  SpO2 97%   BMI 64.75 kg/m  I/O last 3 completed shifts: In: 417.7 [I.V.:167.7; IV Piggyback:250] Out: 350 [Urine:350] Total I/O In: 388 [P.O.:300; I.V.:575; IV Piggyback:50] Out: 375 [Urine:375]  FHT:  FHR: 150 bpm, variability: moderate,  accelerations:  Present,  decelerations:  Absent UC:   regular, every 2-3 minutes SVE:   Dilation: 2 Effacement (%): 50 Station: -3 Exam by:: Orene Desanctis, RN  Labs: Lab Results  Component Value Date   WBC 7.6 07/23/2022   HGB 11.1 (L) 07/23/2022   HCT 34.2 (L) 07/23/2022   MCV 75.5 (L) 07/23/2022   PLT 345 07/23/2022    Assessment / Plan: Induction of labor due to preeclampsia  Labor:  s/p 3rd cytotec at 0000.  S/p SROM at 0110.  Will augment with pitocin 4hrs after placement of last cytotec as indicated. Preeclampsia:  no signs or symptoms of toxicity  Good UOP approx 100cc/hr. Fetal Wellbeing:  Category I Pain Control:   Upon request.  Pt plans to use nitrous oxide. I/D:   GBS + on PCN Anticipated MOD:  NSVD  Delice Lesch, MD 07/24/2022, 1:53 AM

## 2022-07-24 NOTE — Progress Notes (Signed)
Heather Ingram is a 21 y.o. G1P0 at [redacted]w[redacted]d  Subjective: Getting epidural now.  Objective: BP (!) 156/96   Pulse 85   Temp 98.5 F (36.9 C) (Oral)   Resp 16   Ht 5\' 2"  (1.575 m)   Wt (!) 160.6 kg   LMP 10/25/2021 Comment: pt shielded.  SpO2 99%   BMI 64.75 kg/m  I/O last 3 completed shifts: In: 2159 [P.O.:300; I.V.:1365.7; IV Piggyback:493.3] Out: 2125 [Urine:1425; Emesis/NG output:700] No intake/output data recorded.  FHT:  FHR: 140 bpm, variability: moderate,  accelerations:  Present,  decelerations:  Absent UC:   regular, every 2-4 minutes SVE:   Dilation: 5 Effacement (%): 70 Station: -3 Exam by:: Octavio Manns, MD  Labs: Lab Results  Component Value Date   WBC 9.7 07/24/2022   HGB 11.5 (L) 07/24/2022   HCT 34.8 (L) 07/24/2022   MCV 74.5 (L) 07/24/2022   PLT 324 07/24/2022    Assessment / Plan: IOL s/p cytotec x 3 now in spontaneous labor.  Labor: Progressing normally Preeclampsia:  no signs or symptoms of toxicity.  Adequate UOP 50cc/hr. Fetal Wellbeing:  Category I Pain Control:  Epidural I/D:   GBS + on PCN Anticipated MOD:  NSVD  Heather Lesch, MD 07/24/2022, 7:58 AM

## 2022-07-24 NOTE — Anesthesia Procedure Notes (Signed)
Epidural Patient location during procedure: OB Start time: 07/24/2022 7:54 AM End time: 07/24/2022 7:58 AM  Staffing Anesthesiologist: Brennan Bailey, MD Performed: anesthesiologist   Preanesthetic Checklist Completed: patient identified, IV checked, risks and benefits discussed, monitors and equipment checked, pre-op evaluation and timeout performed  Epidural Patient position: sitting Prep: DuraPrep and site prepped and draped Patient monitoring: continuous pulse ox, blood pressure and heart rate Approach: midline Location: L3-L4 Injection technique: LOR air  Needle:  Needle type: Tuohy  Needle gauge: 17 G Needle length: 9 cm Needle insertion depth: 9 cm Catheter type: closed end flexible Catheter size: 19 Gauge Catheter at skin depth: 15 cm Test dose: negative and Other (1% lidocaine)  Assessment Events: blood not aspirated, injection not painful, no injection resistance, no paresthesia and negative IV test  Additional Notes Patient identified. Risks, benefits, and alternatives discussed with patient including but not limited to bleeding, infection, nerve damage, paralysis, failed block, incomplete pain control, headache, blood pressure changes, nausea, vomiting, reactions to medication, itching, and postpartum back pain. Confirmed with bedside nurse the patient's most recent platelet count. Confirmed with patient that they are not currently taking any anticoagulation, have any bleeding history, or any family history of bleeding disorders. Patient expressed understanding and wished to proceed. All questions were answered. Sterile technique was used throughout the entire procedure. Please see nursing notes for vital signs.   Crisp LOR after 2 needle redirections. Test dose was given through epidural catheter and negative prior to continuing to dose epidural or start infusion. Warning signs of high block given to the patient including shortness of breath, tingling/numbness in  hands, complete motor block, or any concerning symptoms with instructions to call for help. Patient was given instructions on fall risk and not to get out of bed. All questions and concerns addressed with instructions to call with any issues or inadequate analgesia.  Reason for block:procedure for pain

## 2022-07-24 NOTE — Lactation Note (Signed)
This note was copied from a baby's chart. Lactation Consultation Note  Patient Name: Girl Laurrie Toppin DHRCB'U Date: 07/24/2022 Reason for consult: L&D Initial assessment;1st time breastfeeding;Early term 37-38.6wks Age:22 hours P1, term female infant. Birth Parent latched infant on her left breast using the football hold position, infant latched with depth, and was still breastfeeding after 10 minutes. Prior to latching infant at the breast, LC ask Birth Parent to do reverse pressure softening prior to latching infant at the breast to help evert nipple shaft out more. Birth Parent can benefit from hand pump to pre-pump prior to latching infant at the breast. Birth Parent will continue to BF infant according to hunger cues, 8 to 12+ times within 24 hrs, STS.  Birth Parent knows she can call RN/LC for latch assistance on MBU if needed.  Maternal Data Does the patient have breastfeeding experience prior to this delivery?: No  Feeding Mother's Current Feeding Choice: Breast Milk and Formula  LATCH Score Latch: Grasps breast easily, tongue down, lips flanged, rhythmical sucking.  Audible Swallowing: Spontaneous and intermittent  Type of Nipple: Everted at rest and after stimulation (Birth Parent  is short shafted, LC did revese pressure soften to help evert nipple shaft out more.)  Comfort (Breast/Nipple): Soft / non-tender  Hold (Positioning): Assistance needed to correctly position infant at breast and maintain latch.  LATCH Score: 9   Lactation Tools Discussed/Used    Interventions Interventions: Assisted with latch;Skin to skin;Breast compression;Adjust position;Support pillows;Position options;Reverse pressure;Education  Discharge    Consult Status Consult Status: Follow-up from L&D    Vicente Serene 07/24/2022, 6:59 PM

## 2022-07-24 NOTE — Anesthesia Preprocedure Evaluation (Signed)
Anesthesia Evaluation  Patient identified by MRN, date of birth, ID band Patient awake    Reviewed: Allergy & Precautions, Patient's Chart, lab work & pertinent test results  History of Anesthesia Complications Negative for: history of anesthetic complications  Airway Mallampati: II  TM Distance: >3 FB Neck ROM: Full    Dental no notable dental hx.    Pulmonary neg pulmonary ROS,    Pulmonary exam normal        Cardiovascular hypertension (gestational), Normal cardiovascular exam     Neuro/Psych negative neurological ROS  negative psych ROS   GI/Hepatic negative GI ROS, Neg liver ROS,   Endo/Other  Morbid obesity (BMI 65)  Renal/GU negative Renal ROS  negative genitourinary   Musculoskeletal negative musculoskeletal ROS (+)   Abdominal   Peds  Hematology negative hematology ROS (+)   Anesthesia Other Findings Day of surgery medications reviewed with patient.  Reproductive/Obstetrics (+) Pregnancy                             Anesthesia Physical Anesthesia Plan  ASA: 3  Anesthesia Plan: Epidural   Post-op Pain Management:    Induction:   PONV Risk Score and Plan: Treatment may vary due to age or medical condition  Airway Management Planned: Natural Airway  Additional Equipment: Fetal Monitoring  Intra-op Plan:   Post-operative Plan:   Informed Consent: I have reviewed the patients History and Physical, chart, labs and discussed the procedure including the risks, benefits and alternatives for the proposed anesthesia with the patient or authorized representative who has indicated his/her understanding and acceptance.       Plan Discussed with:   Anesthesia Plan Comments:         Anesthesia Quick Evaluation

## 2022-07-25 LAB — CBC
HCT: 31.5 % — ABNORMAL LOW (ref 36.0–46.0)
Hemoglobin: 10.4 g/dL — ABNORMAL LOW (ref 12.0–15.0)
MCH: 25.1 pg — ABNORMAL LOW (ref 26.0–34.0)
MCHC: 33 g/dL (ref 30.0–36.0)
MCV: 75.9 fL — ABNORMAL LOW (ref 80.0–100.0)
Platelets: 313 10*3/uL (ref 150–400)
RBC: 4.15 MIL/uL (ref 3.87–5.11)
RDW: 15 % (ref 11.5–15.5)
WBC: 11.8 10*3/uL — ABNORMAL HIGH (ref 4.0–10.5)
nRBC: 0 % (ref 0.0–0.2)

## 2022-07-25 LAB — COMPREHENSIVE METABOLIC PANEL
ALT: 15 U/L (ref 0–44)
AST: 21 U/L (ref 15–41)
Albumin: 2.2 g/dL — ABNORMAL LOW (ref 3.5–5.0)
Alkaline Phosphatase: 128 U/L — ABNORMAL HIGH (ref 38–126)
Anion gap: 8 (ref 5–15)
BUN: 14 mg/dL (ref 6–20)
CO2: 21 mmol/L — ABNORMAL LOW (ref 22–32)
Calcium: 8.6 mg/dL — ABNORMAL LOW (ref 8.9–10.3)
Chloride: 105 mmol/L (ref 98–111)
Creatinine, Ser: 0.79 mg/dL (ref 0.44–1.00)
GFR, Estimated: >60 mL/min (ref >60)
Glucose, Bld: 103 mg/dL — ABNORMAL HIGH (ref 70–99)
Potassium: 3.9 mmol/L (ref 3.5–5.1)
Sodium: 134 mmol/L — ABNORMAL LOW (ref 135–145)
Total Bilirubin: <0.1 mg/dL — ABNORMAL LOW (ref 0.3–1.2)
Total Protein: 5.7 g/dL — ABNORMAL LOW (ref 6.5–8.1)

## 2022-07-25 NOTE — Lactation Note (Signed)
This note was copied from a baby's chart. Lactation Consultation Note  Patient Name: Heather Ingram'O Date: 07/25/2022 Reason for consult: Follow-up assessment;Difficult latch;Early term 37-38.6wks Age:22 hours  Parent and RN requested [place] assistance with latch.  Baby placed skin to skin, rooting noted.  Baby would open to grasp breast but no sucking noted. Suck training attempted but infant did not suck gloved finger. A 2nd attempt was made but baby did not initiate sucking.   Baby appeared gaggy.    LC discussed with RN.  Lactation will plan to revisit.  Encouraged STS, hand express and collect milk into a spoon to give to baby on lips and inside of cheeks.     Maternal Data Has patient been taught Hand Expression?: Yes  Feeding Mother's Current Feeding Choice: Breast Milk  LATCH Score Latch: Too sleepy or reluctant, no latch achieved, no sucking elicited.  Audible Swallowing: None  Type of Nipple: Everted at rest and after stimulation  Comfort (Breast/Nipple): Soft / non-tender  Hold (Positioning): Assistance needed to correctly position infant at breast and maintain latch.  LATCH Score: 5   Lactation Tools Discussed/Used    Interventions Interventions: Breast feeding basics reviewed;Support pillows;Adjust position;Position options  Discharge    Consult Status Consult Status: Follow-up Date: 07/25/22 Follow-up type: In-patient    Ferne Coe [facility] 07/25/2022, 12:02 PM

## 2022-07-25 NOTE — Anesthesia Postprocedure Evaluation (Signed)
Anesthesia Post Note  Patient: Heather Ingram  Procedure(s) Performed: AN AD [place]     Patient location during evaluation: Mother Baby Anesthesia Type: Epidural Level of consciousness: awake and alert Pain management: pain level controlled Vital Signs Assessment: post-procedure vital signs reviewed and stable Respiratory status: spontaneous breathing, nonlabored ventilation and respiratory function stable Cardiovascular status: stable Postop Assessment: no headache, no backache and epidural receding Anesthetic complications: no   No notable events documented.  Last Vitals:  Vitals:   07/25/22 0629 07/25/22 1000  BP: 116/88 127/76  Pulse: 80 86  Resp: 18 20  Temp: 37.2 C 36.7 C  SpO2: 100% 97%    Last Pain:  Vitals:   07/25/22 1130  TempSrc:   PainSc: 0-No pain   Pain Goal:                   Annai Heick

## 2022-07-25 NOTE — Progress Notes (Signed)
Post Partum Day 1 Subjective: no complaints, up ad lib, voiding, and tolerating PO  Objective: Blood pressure 112/72, pulse 72, temperature 98.2 F (36.8 C), temperature source Oral, resp. rate 18, height 5\' 2"  (1.575 m), weight (!) 160.6 kg, last menstrual period 10/25/2021, SpO2 98 %, unknown if currently breastfeeding.    07/25/2022    9:50 PM 07/25/2022    6:30 PM 07/25/2022    5:35 PM  Vitals with BMI  Systolic 349 179 150  Diastolic 72 81 91  Pulse 72  85    Physical Exam:  General: alert, cooperative, and no distress Lochia: appropriate Uterine Fundus: firm Incision: N/A DVT Evaluation: No evidence of DVT seen on physical exam. Calf/Ankle edema is present.  Recent Labs    07/24/22 1940 07/25/22 0631  HGB 10.7* 10.4*  HCT 32.5* 31.5*    Assessment/Plan:  21 y/o PPD # 1 after vaginal delivery, with Preeclampsia without sever features, Plan for discharge tomorrow Routine post partum care. Close BP follow up.    LOS: 2 days   Archie Endo, MD 07/25/2022, 11:44 PM

## 2022-07-25 NOTE — Lactation Note (Signed)
This note was copied from a baby's chart. Lactation Consultation Note  Patient Name: Heather Ingram [id number]'V Date: 07/25/2022 Reason for consult: Initial assessment;1st time breastfeeding;Early term 37-38.6wks Age:22 hours Birth Parent requested latch assistance from [facility] Birth Parent latched infant on her right breast in football hold position, infant sustained latch, swallows heard, infant was still breastfeeding after 11 minutes when LC left the room. Birth Parent will continue to work on latching infant at the breast and will ask RN/LC for further latch assistance if needed. Birth Parent will continue to breastfeed infant according to hunger cues, on demand, 8 to 12+ times within 24 hours, STS. LC discussed importance of maternal rest, diet and hydration with Birth Parent. Mom made aware of O/P services, breastfeeding support groups, community resources, and our phone # for post-discharge questions.   Maternal Data Has patient been taught Hand Expression?: Yes Does the patient have breastfeeding experience prior to this delivery?: No  Feeding Mother's Current Feeding Choice: Breast Milk  LATCH Score Latch: Grasps breast easily, tongue down, lips flanged, rhythmical sucking.  Audible Swallowing: Spontaneous and intermittent  Type of Nipple: Everted at rest and after stimulation (short shafted did reverse pressure softening prior to latching infant, infant latched well and sustained latch.)  Comfort (Breast/Nipple): Soft / non-tender  Hold (Positioning): Assistance needed to correctly position infant at breast and maintain latch.  LATCH Score: 9   Lactation Tools Discussed/Used    Interventions Interventions: Breast feeding basics reviewed;Assisted with latch;Skin to skin;Support pillows;Reverse pressure;Position options;Education;LC Services brochure  Discharge    Consult Status Consult Status: Follow-up Date: 07/25/22 Follow-up type: In-patient    Vicente Serene 07/25/2022, 12:51 AM

## 2022-07-25 NOTE — Social Work (Signed)
CSW received consult for hx of Anxiety and Depression.  CSW met with MOB to offer support and complete assessment. CSW entered the room, introduced self, CSW role and reason for visit. CSW observed FOB up in the room, and MOB in bed holding the infant. CSW offered privacy, MOB allowed CSW to speak in front of FOB. CSW inquired about how MOB was feeling, MOB reported she was good and her delivery went well. 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 MOB reported she never took the medication because she also found out she was pregnant around that time and was not interested In taking medication with pregnancy. CSW inquired if MOB plans on starting medication now that she has delivered, MOB stated "if it doesn't effect my breastmilk" CSW encouraged MOB to consult with her prescribing physician. CSW inquired about therapy, MOB reported non but expressed interest beginning therapy, CSW provided therapy resources. CSW assessed for safety, MOB denied any SI, HI or DV. MOB identified FOB as her support. CSW provided education regarding the baby blues period vs. perinatal mood disorders, discussed treatment and gave resources for mental health follow up if concerns arise.  CSW recommends self-evaluation during the postpartum time period using the New Mom Checklist from Postpartum Progress and encouraged MOB to contact a medical professional if symptoms are noted at any time.    CSW provided review of Sudden Infant Death Syndrome (SIDS) precautions. MOB identified Dr. Reid for infants follow up care. MOB reported they have all necessary items for the infant. CSW inquired about additional resources. MOB is interested in [facility], referral made. CSW identifies no further need for intervention and no barriers to discharge at this time.   Dakin Madani, LCSWA Clinical Social Worker [phone] 

## 2022-07-26 MED ORDER — IBUPROFEN 600 MG PO TABS: 600.0000 mg | ORAL_TABLET | Freq: Four times a day (QID) | ORAL | 0 refills | Status: DC

## 2022-07-26 NOTE — Discharge Summary (Signed)
Postpartum Discharge Summary    Patient Name: Heather Ingram DOB: [birth year] MRN: [id number]  Date of admission: 07/23/2022 Delivery date:07/24/2022  Delivering provider: Everett Graff  Date of discharge: 07/26/2022  Admitting diagnosis: Preeclampsia, third trimester [O14.93] Intrauterine pregnancy: [redacted]w[redacted]d     Secondary diagnosis:  Principal Problem:   Preeclampsia, third trimester  Additional problems:  Maternal obesity with BMI 60.    Discharge diagnosis: Term Pregnancy Delivered                                              Post partum procedures: None.  Complications: None  Hospital course: Induction of Labor With Vaginal Delivery   21 y.o. yo G1P1001 at [redacted]w[redacted]d was admitted to the hospital 07/23/2022 for induction of labor.  Indication for induction:  Preeclampsia without severe features .  Membrane Rupture Time/Date: 1:10 AM ,07/24/2022   Delivery Method:Vaginal, Spontaneous  Episiotomy: None  Lacerations:  1st degree;Vaginal  Details of delivery can be found in separate delivery note.  Patient had an uncomplicated postpartum course.  Patient is discharged home 07/26/22.  Newborn Data: Birth date:07/24/2022  Birth time:5:24 PM  Gender:Female  Living status:Living  Apgars:8 ,9  Weight:3530 g   Magnesium Sulfate received: No  Physical exam  Vitals:   07/25/22 1735 07/25/22 1830 07/25/22 2150 07/26/22 0512  BP: (!) 131/91 128/81 112/72 135/76  Pulse: 85  72 100  Resp:   18 16  Temp:   98.2 F (36.8 C) 98 F (36.7 C)  TempSrc:   Oral Oral  SpO2:   98% 99%  Weight:      Height:       General: alert, cooperative, and no distress Lochia: appropriate Uterine Fundus: firm Incision: N/A DVT Evaluation: No evidence of DVT seen on physical exam. Calf/Ankle edema is present, 3+ edema bilaterally in lower extremities.    Labs: Lab Results  Component Value Date   WBC 11.8 (H) 07/25/2022   HGB 10.4 (L) 07/25/2022   HCT 31.5 (L) 07/25/2022   MCV 75.9 (L)  07/25/2022   PLT 313 07/25/2022      Latest Ref Rng & Units 07/25/2022    6:31 AM  CMP  Glucose 70 - 99 mg/dL 103   BUN 6 - 20 mg/dL 14   Creatinine 0.44 - 1.00 mg/dL 0.79   Sodium 135 - 145 mmol/L 134   Potassium 3.5 - 5.1 mmol/L 3.9   Chloride 98 - 111 mmol/L 105   CO2 22 - 32 mmol/L 21   Calcium 8.9 - 10.3 mg/dL 8.6   Total Protein 6.5 - 8.1 g/dL 5.7   Total Bilirubin 0.3 - 1.2 mg/dL <0.1   Alkaline Phos 38 - 126 U/L 128   AST 15 - 41 U/L 21   ALT 0 - 44 U/L 15    Edinburgh Score:    07/25/2022   10:00 AM  Edinburgh Postnatal Depression Scale Screening Tool  I have been able to laugh and see the funny side of things. 0  I have looked forward with enjoyment to things. 0  I have blamed myself unnecessarily when things went wrong. 0  I have been anxious or worried for no good reason. 2  I have felt scared or panicky for no good reason. 2  Things have been getting on top of me. 1  I have been so  unhappy that I have had difficulty sleeping. 0  I have felt sad or miserable. 0  I have been so unhappy that I have been crying. 0  The thought of harming myself has occurred to me. 0  Edinburgh Postnatal Depression Scale Total 5     After visit meds:  Allergies as of 07/26/2022   No Known Allergies      Medication List     TAKE these medications    ibuprofen 600 MG tablet Commonly known as: ADVIL Take 1 tablet (600 mg total) by mouth every 6 (six) hours.   prenatal multivitamin Tabs tablet Take 1 tablet by mouth daily at 12 noon.   Vitamin D3 50 MCG (2000 UT) capsule Take 4,000 Units by mouth daily.       Discharge home in stable condition Infant Feeding: Breast Infant Disposition:home with mother Discharge instruction: per After Visit Summary and Postpartum booklet. Activity: Advance as tolerated. Pelvic rest for 6 weeks.  Diet: routine diet Future Appointments:No future appointments. Follow up Visit:   Follow-up Information     Ob/Gyn, [place]. Schedule an appointment as soon as possible for a visit in 6 week(s).   Specialty: Obstetrics and Gynecology Why: POstpartum check. Contact information: [address] [place] [address] [phone]         Ob/Gyn, [place]. Schedule an appointment as soon as possible for a visit in 1 week(s).   Specialty: Obstetrics and Gynecology Why: Blood pressure check visit. Contact information: [address] [place] [address] [phone]                 Anticipated Birth Control:  Unsure  07/26/2022, MD.  Archie Endo, MD

## 2022-07-30 ENCOUNTER — Ambulatory Visit: Payer: Medicaid Other

## 2022-07-31 ENCOUNTER — Inpatient Hospital Stay (HOSPITAL_COMMUNITY)
Admission: AD | Admit: 2022-07-31 | Payer: Medicaid Other | Source: Home / Self Care | Admitting: Obstetrics and Gynecology

## 2022-07-31 ENCOUNTER — Inpatient Hospital Stay (HOSPITAL_COMMUNITY): Payer: Medicaid Other

## 2022-08-01 ENCOUNTER — Telehealth (HOSPITAL_COMMUNITY): Payer: Self-pay

## 2022-08-01 NOTE — Telephone Encounter (Signed)
Patient did not answer phone call. Voicemail left for patient.   Sharyn Lull [facility] 08/01/2022,1938

## 2023-03-16 IMAGING — US US OB < 14 WEEKS - US OB TV
1 series · 15 of 28 positions shown · non-contrast
Comparison: None.

CLINICAL DATA: Bleeding

EXAM:
OBSTETRIC <14 WK US AND TRANSVAGINAL OB US
TECHNIQUE: Both transabdominal and transvaginal ultrasound examinations were
performed for complete evaluation of the gestation as well as the
maternal uterus, adnexal regions, and pelvic cul-de-sac.
Transvaginal technique was performed to assess early pregnancy.

[Series 1: us ob < 14 weeks - us ob tv · 15 of 64 slices shown]
[im 1/64]
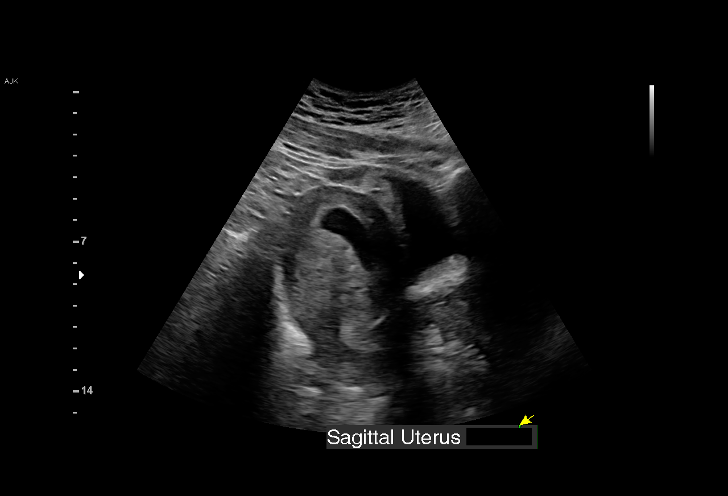
[im 5/64]
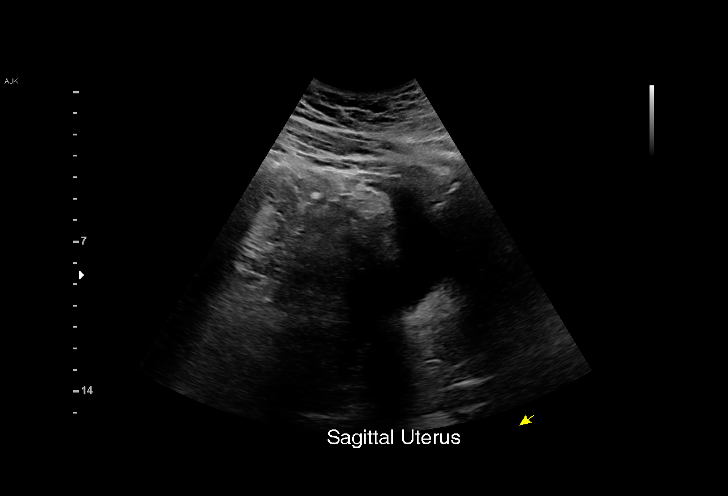
[im 10/64]
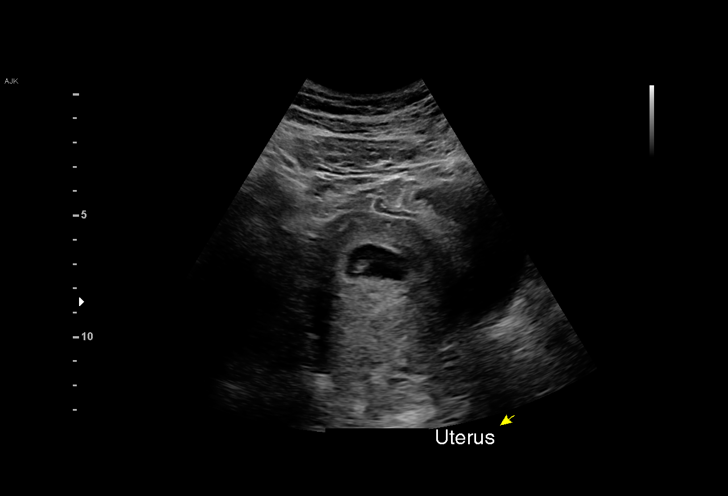
[im 15/64]
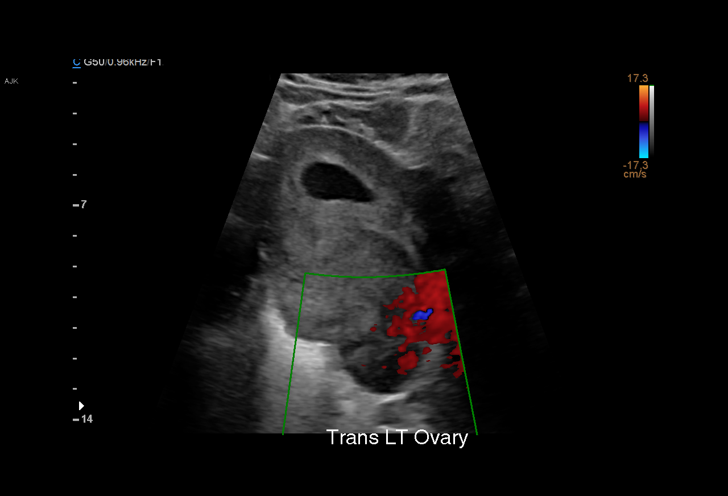
[im 19/64]
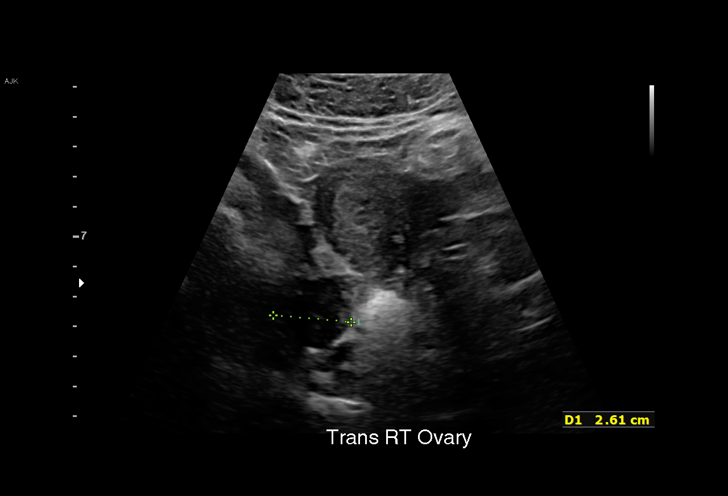
[im 24/64]
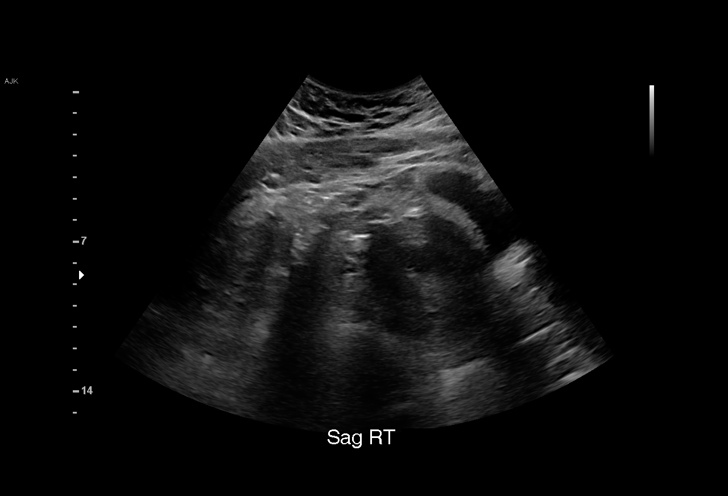
[im 29/64]
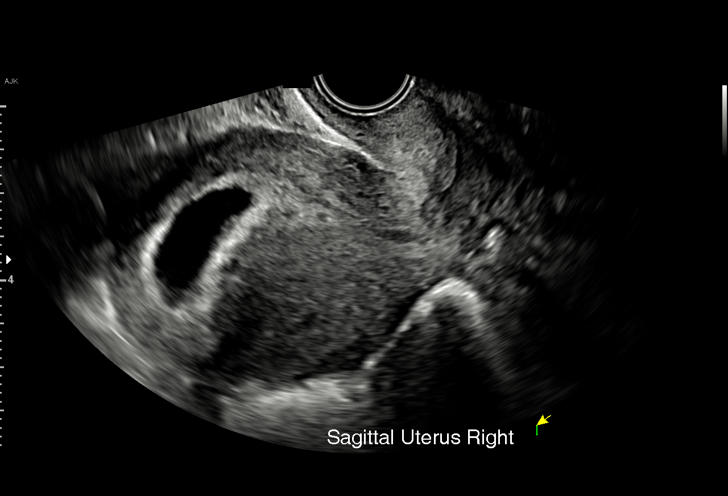
[im 33/64]
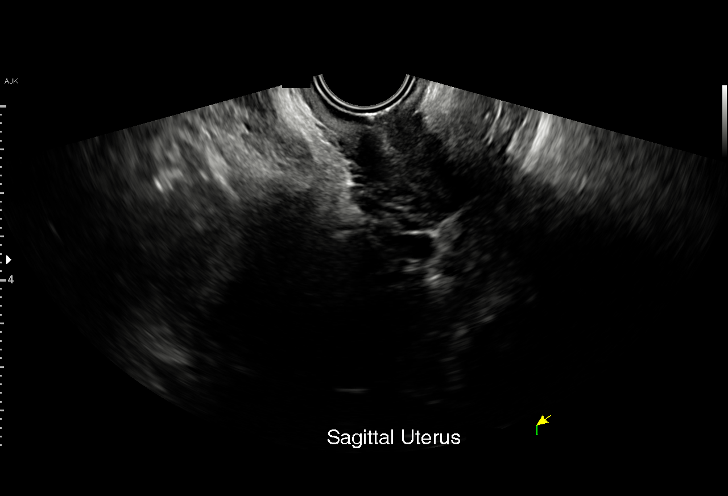
[im 36/64]
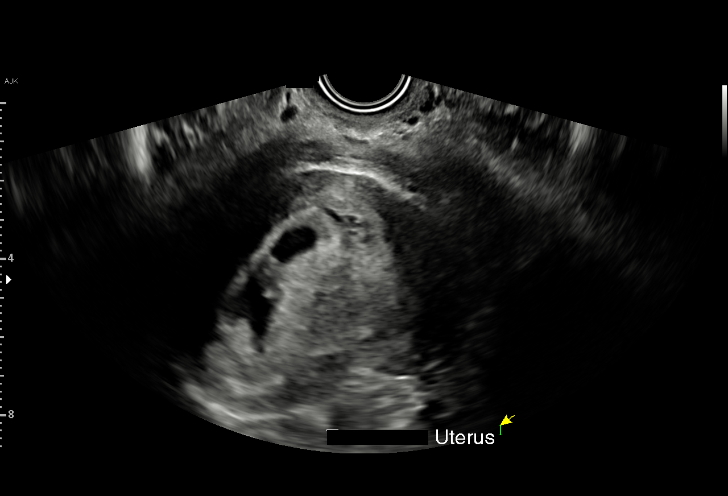
[im 40/64]
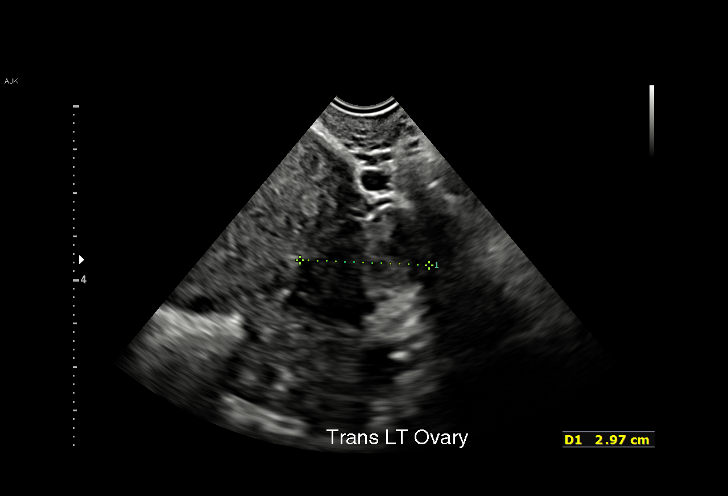
[im 45/64]
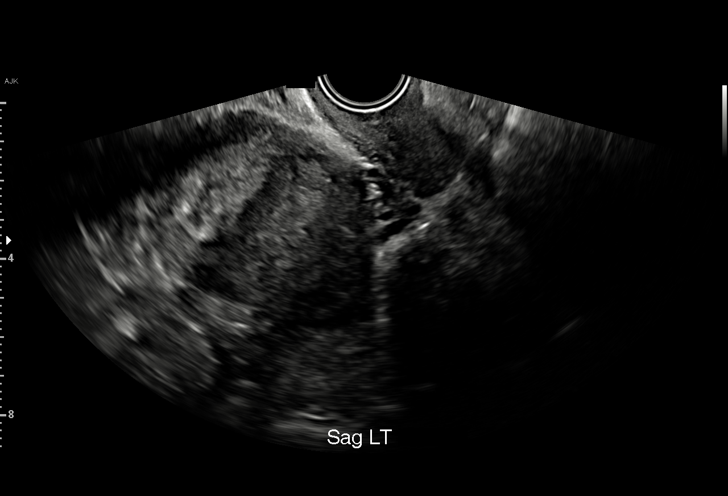
[im 50/64]
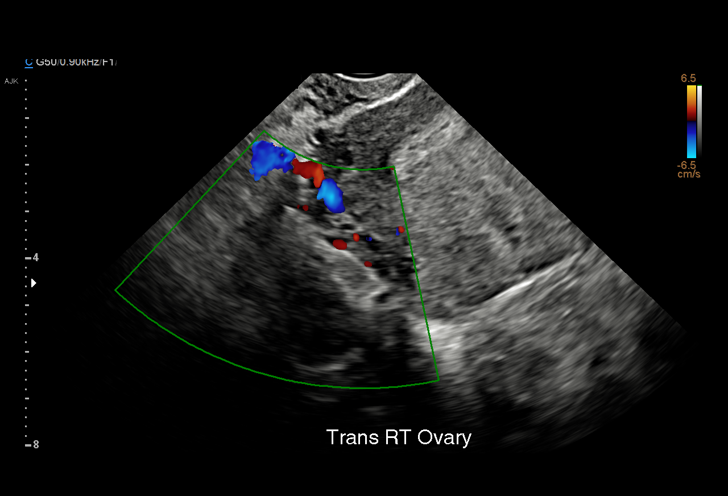
[im 54/64]
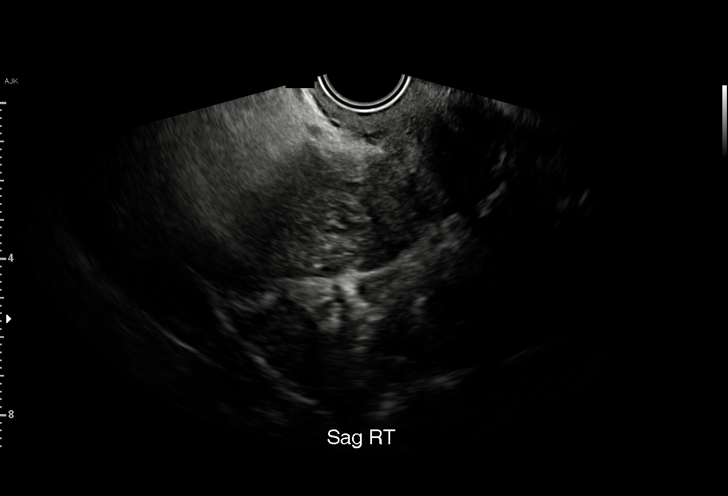
[im 59/64]
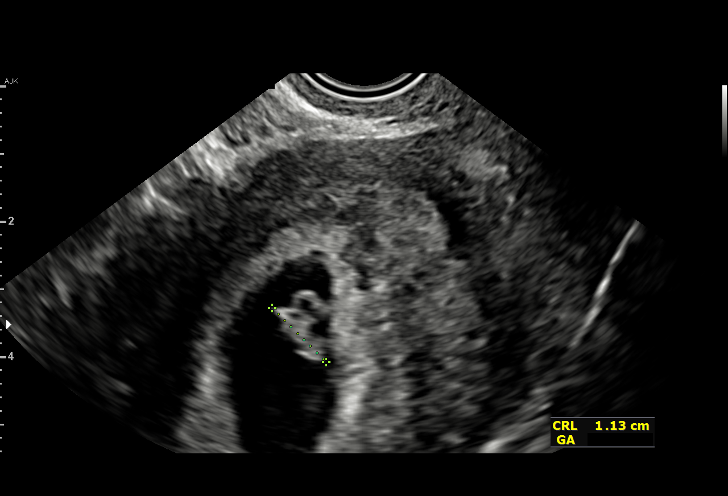
[im 64/64]
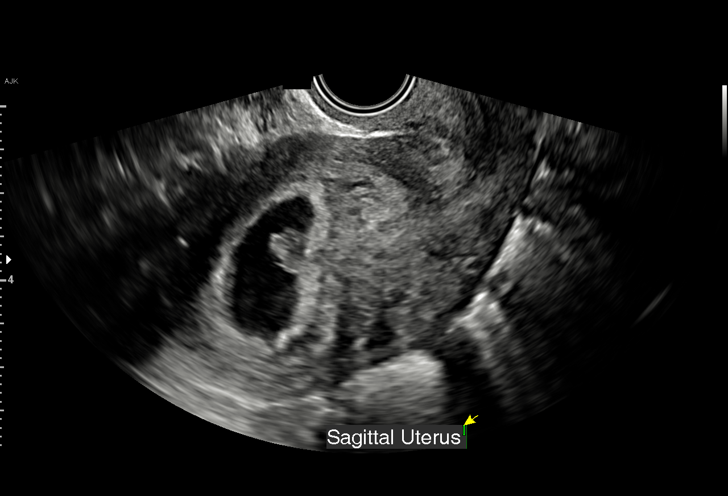

[15 of 28 positions shown; findings below may reference images not displayed]

FINDINGS: Intrauterine gestational sac: Single

Yolk sac:  Visualized.

Embryo:  Visualized.

Cardiac Activity: Visualized.

Heart Rate: 135 bpm

MSD:   mm    w     d

CRL:  11.6 mm   7 w   2 d                  US EDC: 07/31/2022

Subchorionic hemorrhage:  None visualized.

Maternal uterus/adnexae: No adnexal mass or free fluid.
IMPRESSION: Seven week 2 day intrauterine pregnancy. Fetal heart rate 135 beats
per minute. No acute maternal findings.

## 2023-06-07 IMAGING — US US MFM OB DETAIL+14 WK
1 series · 13 of 28 positions shown · non-contrast
Comparison: none

[Series 1: us mfm ob detail+14 wk · 13 of 69 slices shown]
[im 3/69]
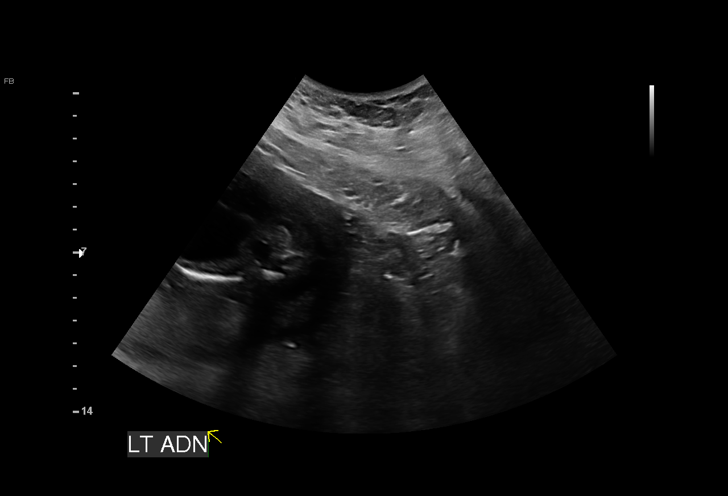
[im 8/69]
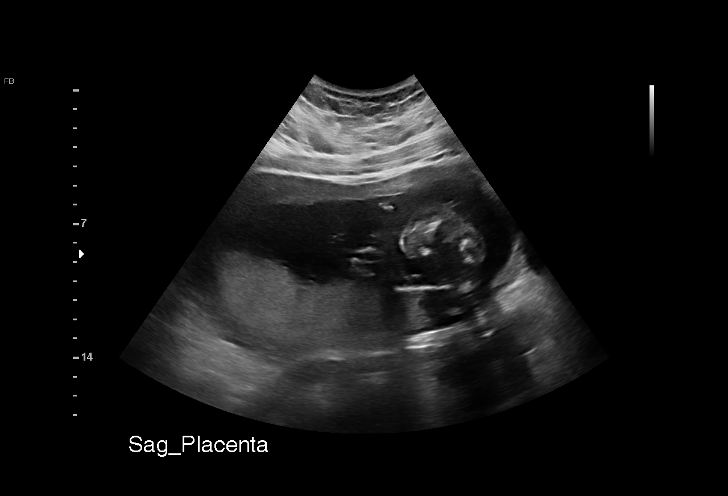
[im 13/69]
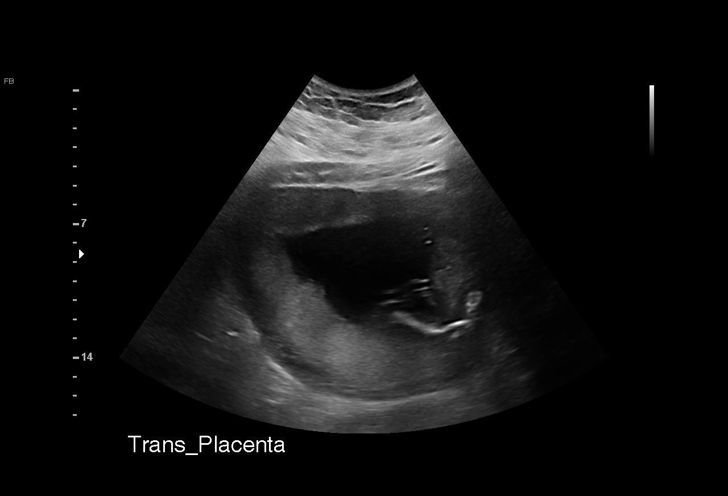
[im 18/69]
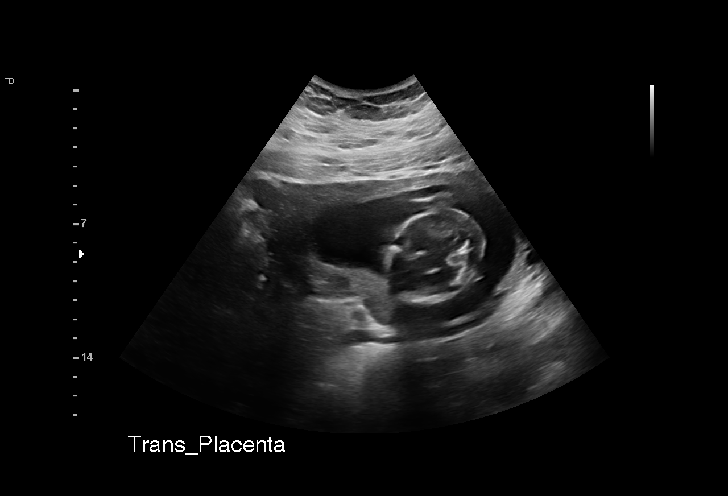
[im 23/69]
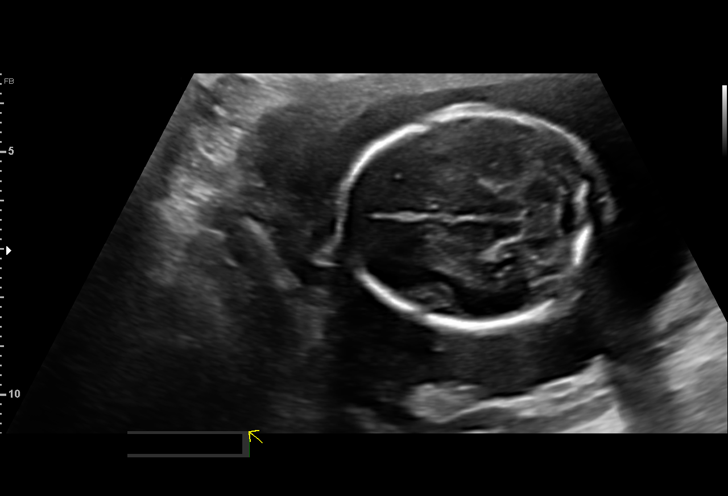
[im 28/69]
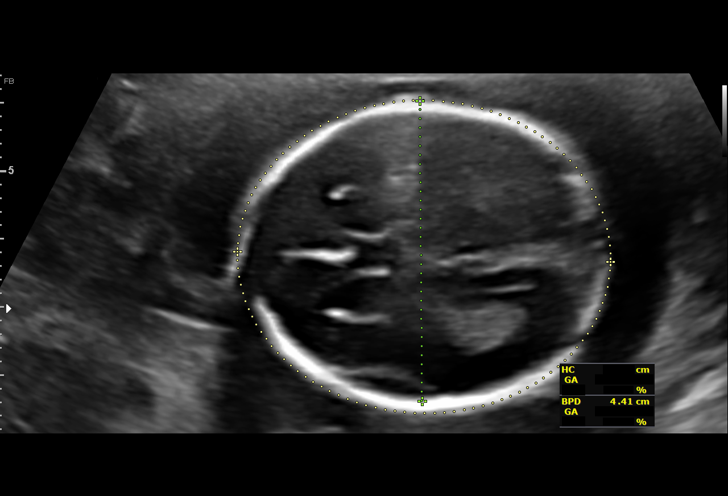
[im 36/69]
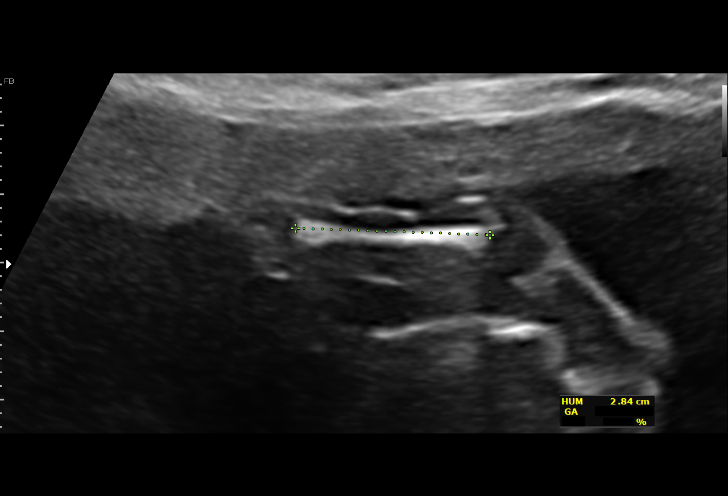
[im 41/69]
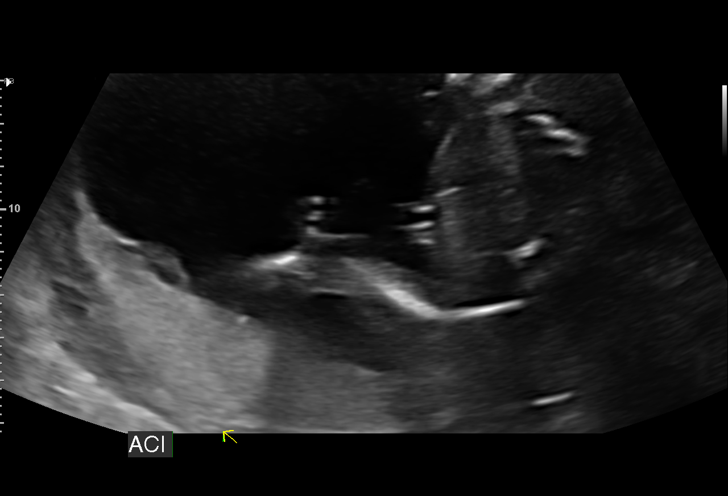
[im 46/69]
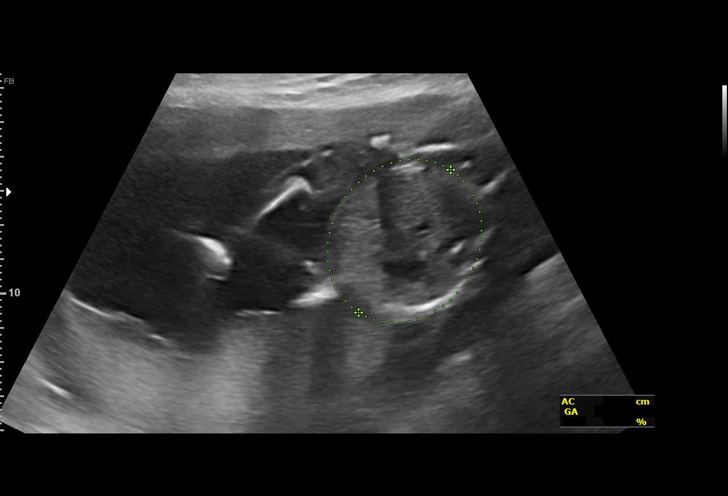
[im 51/69]
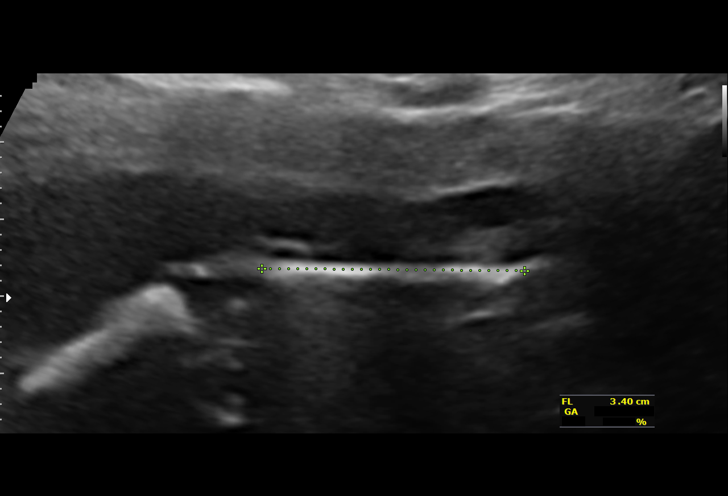
[im 56/69]
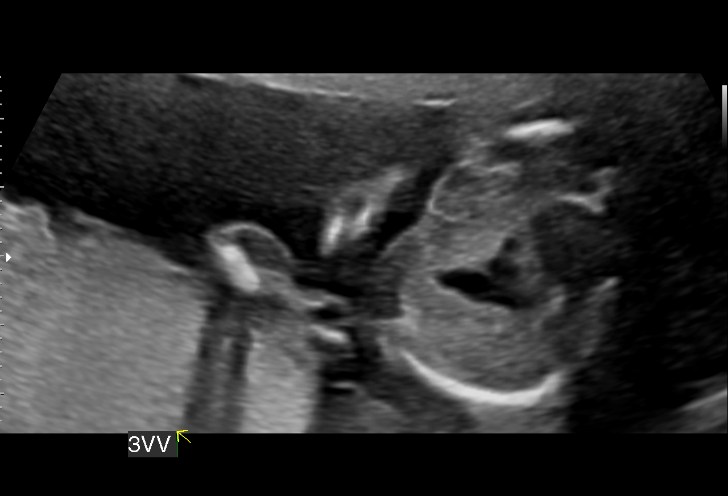
[im 61/69]
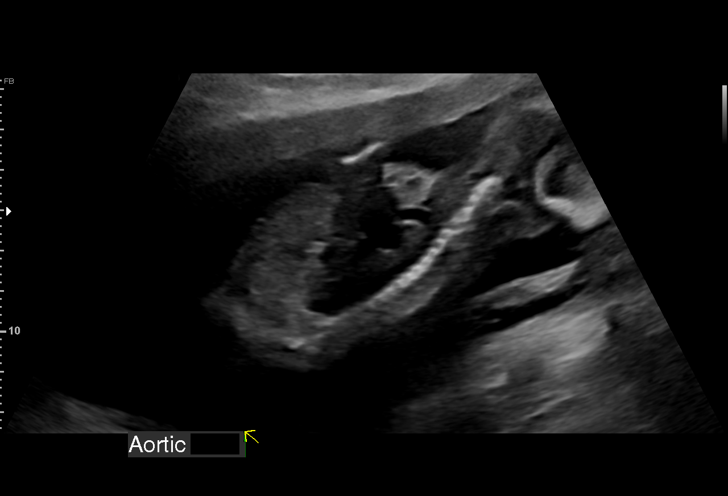
[im 66/69]
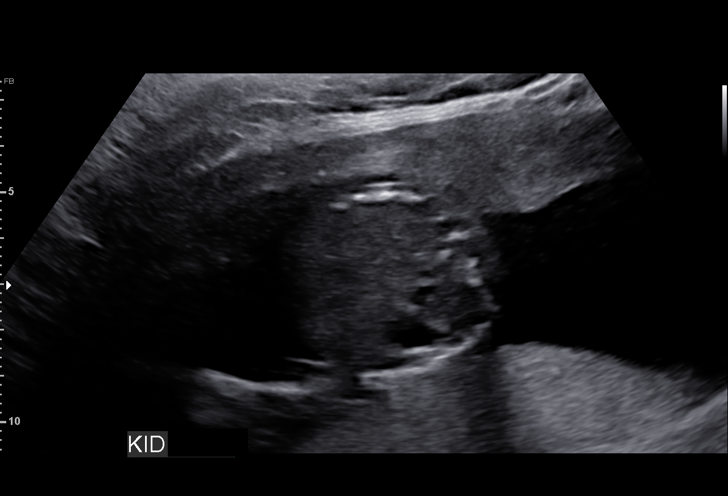

[13 of 28 positions shown; findings below may reference images not displayed]

Name:       KLEVER JUMPER             Visit Date: 03/07/2022 [DATE]
             [REDACTED]

Indications

 Obesity complicating pregnancy, second
 trimester (BMI 58.79)
 Encounter for antenatal screening for
 malformations
 19 weeks gestation of pregnancy
 LR NIPS/Neg AFP/Neg Horizon
Fetal Evaluation

 Num Of Fetuses:         1
 Fetal Heart Rate(bpm):  152
 Cardiac Activity:       Observed
 Presentation:           Cephalic
 Placenta:               Posterior
 P. Cord Insertion:      Visualized, central

 Amniotic Fluid
 AFI FV:      Within normal limits

                             Largest Pocket(cm)

Biometry

 BPD:      43.9  mm     G. Age:  19w 2d         63  %    CI:        75.68   %    70 - 86
                                                         FL/HC:      20.0   %    16.1 -
 HC:       160   mm     G. Age:  18w 6d         34  %    HC/AC:      1.14        1.09 -
 AC:      140.4  mm     G. Age:  19w 3d         60  %    FL/BPD:     72.9   %
 FL:         32  mm     G. Age:  20w 0d         77  %    FL/AC:      22.8   %    20 - 24
 HUM:      28.4  mm     G. Age:  19w 1d         55  %
 CER:      19.4  mm     G. Age:  18w 6d         30  %
 NFT:       4.7  mm
 LV:        7.5  mm
 CM:        3.6  mm

 Est. FW:     301  gm    0 lb 11 oz      79  %
OB History

 Gravidity:    1
Gestational Age

 LMP:           19w 0d        Date:  10/25/21                 EDD:   08/01/22
 U/S Today:     19w 3d                                        EDD:   07/29/22
 Best:          19w 0d     Det. By:  LMP  (10/25/21)          EDD:   08/01/22
Anatomy

 Cranium:               Appears normal         Aortic Arch:            Appears normal
 Cavum:                 Appears normal         Ductal Arch:            Appears normal
 Ventricles:            Appears normal         Diaphragm:              Appears normal
 Choroid Plexus:        Appears normal         Stomach:                Appears normal, left
                                                                       sided
 Cerebellum:            Appears normal         Abdomen:                Appears normal
 Posterior Fossa:       Appears normal         Abdominal Wall:         Appears nml (cord
                                                                       insert, abd wall)
 Nuchal Fold:           Appears normal         Cord Vessels:           Appears normal (3
                                                                       vessel cord)
 Face:                  Orbits nl; profile not Kidneys:                Not well visualized
                        well visualized
 Lips:                  Appears normal         Bladder:                Appears normal
 Thoracic:              Appears normal         Spine:                  Limited views
                                                                       appear normal
 Heart:                 Not well visualized    Upper Extremities:      Appears normal
 RVOT:                  Not well visualized    Lower Extremities:      Appears normal
 LVOT:                  Not well visualized

 Other:  Fetus appears to be female. Lenses visualized. VC, 3VV visualized.
Cervix Uterus Adnexa

 Cervix
 Length:           4.71  cm.
 Normal appearance by transabdominal scan.

 Adnexa
 No abnormality visualized.
Impression

 Single intrauterine pregnancy here for a detailed anatomy
 due to elevated BMI
 Normal anatomy with measurements consistent with dates
 There is good fetal movement and amniotic fluid volume
 Suboptimal views of the fetal anatomy were obtained
 secondary to fetal position.
Recommendations

 Follow up growth and anatomy in 4 weeks.

## 2023-10-22 NOTE — L&D Delivery Note (Signed)
 Delivery Note At 1:42 PM a viable female was delivered via Vaginal, Spontaneous (Presentation:   Occiput Anterior).  APGAR: 9, 9; weight  Pending.   Placenta status: Spontaneous, Intact.  Cord: 3 vessels with the following complications: None.  Cord pH: NA  Anesthesia: Epidural Episiotomy: None Lacerations: None Suture Repair: NA Est. Blood Loss (mL): approximately 225 mL    Mom to postpartum.  Baby to Couplet care / Skin to Skin. Mom desires circumcision of Newborn... r/b/a discussed  Rexene Hoit 07/31/2024, 1:58 PM

## 2023-12-26 ENCOUNTER — Inpatient Hospital Stay (HOSPITAL_COMMUNITY)
Admission: AD | Admit: 2023-12-26 | Discharge: 2023-12-26 | Disposition: A | Discharge status: Home / Self Care | Attending: Obstetrics & Gynecology | Admitting: Obstetrics & Gynecology

## 2023-12-26 ENCOUNTER — Encounter (HOSPITAL_COMMUNITY): Payer: Self-pay | Admitting: Obstetrics & Gynecology

## 2023-12-26 ENCOUNTER — Inpatient Hospital Stay (HOSPITAL_COMMUNITY)

## 2023-12-26 DIAGNOSIS — N760 Acute vaginitis: Secondary | ICD-10-CM

## 2023-12-26 DIAGNOSIS — O208 Other hemorrhage in early pregnancy: Secondary | ICD-10-CM | POA: Diagnosis present

## 2023-12-26 DIAGNOSIS — O23591 Infection of other part of genital tract in pregnancy, first trimester: Secondary | ICD-10-CM | POA: Insufficient documentation

## 2023-12-26 DIAGNOSIS — B9689 Other specified bacterial agents as the cause of diseases classified elsewhere: Secondary | ICD-10-CM | POA: Insufficient documentation

## 2023-12-26 DIAGNOSIS — O26891 Other specified pregnancy related conditions, first trimester: Secondary | ICD-10-CM

## 2023-12-26 DIAGNOSIS — Z3A01 Less than 8 weeks gestation of pregnancy: Secondary | ICD-10-CM | POA: Insufficient documentation

## 2023-12-26 DIAGNOSIS — O4690 Antepartum hemorrhage, unspecified, unspecified trimester: Secondary | ICD-10-CM

## 2023-12-26 LAB — COMPREHENSIVE METABOLIC PANEL
ALT: 35 U/L (ref 0–44)
AST: 27 U/L (ref 15–41)
Albumin: 3.4 g/dL — ABNORMAL LOW (ref 3.5–5.0)
Alkaline Phosphatase: 46 U/L (ref 38–126)
Anion gap: 8 (ref 5–15)
BUN: 8 mg/dL (ref 6–20)
CO2: 24 mmol/L (ref 22–32)
Calcium: 9.2 mg/dL (ref 8.9–10.3)
Chloride: 104 mmol/L (ref 98–111)
Creatinine, Ser: 0.6 mg/dL (ref 0.44–1.00)
GFR, Estimated: >60 mL/min (ref >60)
Glucose, Bld: 115 mg/dL — ABNORMAL HIGH (ref 70–99)
Potassium: 4 mmol/L (ref 3.5–5.1)
Sodium: 136 mmol/L (ref 135–145)
Total Bilirubin: 0.4 mg/dL (ref 0.0–1.2)
Total Protein: 6.5 g/dL (ref 6.5–8.1)

## 2023-12-26 LAB — CBC WITH DIFFERENTIAL/PLATELET
Abs Immature Granulocytes: 0.03 10*3/uL (ref 0.00–0.07)
Basophils Absolute: 0 10*3/uL (ref 0.0–0.1)
Basophils Relative: 1 %
Eosinophils Absolute: 0.1 10*3/uL (ref 0.0–0.5)
Eosinophils Relative: 1 %
HCT: 34.8 % — ABNORMAL LOW (ref 36.0–46.0)
Hemoglobin: 11.9 g/dL — ABNORMAL LOW (ref 12.0–15.0)
Immature Granulocytes: 0 %
Lymphocytes Relative: 30 %
Lymphs Abs: 2.1 10*3/uL (ref 0.7–4.0)
MCH: 27.4 pg (ref 26.0–34.0)
MCHC: 34.2 g/dL (ref 30.0–36.0)
MCV: 80 fL (ref 80.0–100.0)
Monocytes Absolute: 0.5 10*3/uL (ref 0.1–1.0)
Monocytes Relative: 7 %
Neutro Abs: 4.2 10*3/uL (ref 1.7–7.7)
Neutrophils Relative %: 61 %
Platelets: 272 10*3/uL (ref 150–400)
RBC: 4.35 MIL/uL (ref 3.87–5.11)
RDW: 13.5 % (ref 11.5–15.5)
WBC: 6.9 10*3/uL (ref 4.0–10.5)
nRBC: 0 % (ref 0.0–0.2)

## 2023-12-26 LAB — HCG, QUANTITATIVE, PREGNANCY: hCG, Beta Chain, Quant, S: 26363 m[IU]/mL — ABNORMAL HIGH (ref <5)

## 2023-12-26 LAB — HEMOGLOBIN A1C
Hgb A1c MFr Bld: 4.9 % (ref 4.8–5.6)
Mean Plasma Glucose: 93.93 mg/dL

## 2023-12-26 LAB — URINALYSIS, ROUTINE W REFLEX MICROSCOPIC
Bilirubin Urine: NEGATIVE
Glucose, UA: NEGATIVE mg/dL
Hgb urine dipstick: NEGATIVE
Ketones, ur: NEGATIVE mg/dL
Leukocytes,Ua: NEGATIVE
Nitrite: NEGATIVE
Protein, ur: NEGATIVE mg/dL
Specific Gravity, Urine: 1.017 (ref 1.005–1.030)
pH: 7 (ref 5.0–8.0)

## 2023-12-26 LAB — WET PREP, GENITAL
Sperm: NONE SEEN
Trich, Wet Prep: NONE SEEN
WBC, Wet Prep HPF POC: <10 (ref <10)
Yeast Wet Prep HPF POC: NONE SEEN

## 2023-12-26 LAB — POCT PREGNANCY, URINE: Preg Test, Ur: POSITIVE — AB

## 2023-12-26 LAB — HIV ANTIBODY (ROUTINE TESTING W REFLEX): HIV Screen 4th Generation wRfx: NONREACTIVE

## 2023-12-26 MED ORDER — METRONIDAZOLE 500 MG PO TABS: 500.0000 mg | ORAL_TABLET | Freq: Two times a day (BID) | ORAL | 0 refills | Status: AC

## 2023-12-26 NOTE — MAU Provider Note (Signed)
 History     CSN: [id number]  Arrival date and time: 12/26/23 1231   None     Chief Complaint  Patient presents with   Vaginal Bleeding   HPI  [name] Is a 23 y.o. female G2P1001 @ [redacted]w[redacted]d here in MAU with complaints of vaginal bleeding. The bleeding is similar to a light period, this morning the bleeding was more like spotting. The color is bright red. She reports lower abdominal pain that comes and goes. No recent intercourse.   History of gonorrhea in June- she was treated.  She has a new OB appointment set up with [facility]   OB History     Gravida  2   Para  1   Term  1   Preterm      AB      Living  1      SAB      IAB      Ectopic      Multiple  0   Live Births  1           Past Medical History:  Diagnosis Date   Medical history non-contributory     Past Surgical History:  Procedure Laterality Date   WISDOM TOOTH EXTRACTION      Family History  Problem Relation Age of Onset   Diabetes Maternal Aunt    Healthy Mother    Healthy Father     Social History   Tobacco Use   Smoking status: Never   Smokeless tobacco: Never  Vaping Use   Vaping status: Former   Start date: 10/21/2018   Quit date: 12/09/2021   Substances: Nicotine  Substance Use Topics   Alcohol use: Never   Drug use: Never    Allergies: No Known Allergies  Medications Prior to Admission  Medication Sig Dispense Refill Last Dose/Taking   Cholecalciferol (VITAMIN D3) 50 MCG (2000 UT) capsule Take 4,000 Units by mouth daily.      ibuprofen (ADVIL) 600 MG tablet Take 1 tablet (600 mg total) by mouth every 6 (six) hours. 30 tablet 0    Prenatal Vit-Fe Fumarate-FA (PRENATAL MULTIVITAMIN) TABS tablet Take 1 tablet by mouth daily at 12 noon.      Results for orders placed or performed during the hospital encounter of 12/26/23 (from the past 48 hours)  Urinalysis, Routine w reflex microscopic -Urine, Clean Catch     Status: None   Collection Time: 12/26/23   1:37 PM  Result Value Ref Range   Color, Urine YELLOW YELLOW   APPearance CLEAR CLEAR   Specific Gravity, Urine 1.017 1.005 - 1.030   pH 7.0 5.0 - 8.0   Glucose, UA NEGATIVE NEGATIVE mg/dL   Hgb urine dipstick NEGATIVE NEGATIVE   Bilirubin Urine NEGATIVE NEGATIVE   Ketones, ur NEGATIVE NEGATIVE mg/dL   Protein, ur NEGATIVE NEGATIVE mg/dL   Nitrite NEGATIVE NEGATIVE   Leukocytes,Ua NEGATIVE NEGATIVE    Comment: Performed at [facility], [address], [place], [place] [address]  Pregnancy, urine POC     Status: Abnormal   Collection Time: 12/26/23  1:41 PM  Result Value Ref Range   Preg Test, Ur POSITIVE (A) NEGATIVE    Comment:        THE SENSITIVITY OF THIS METHODOLOGY IS >24 mIU/mL   Hemoglobin A1c     Status: None   Collection Time: 12/26/23  1:54 PM  Result Value Ref Range   Hgb A1c MFr Bld 4.9 4.8 -  5.6 %    Comment: (NOTE) Pre diabetes:          5.7%-6.4%  Diabetes:              >6.4%  Glycemic control for   <7.0% adults with diabetes    Mean Plasma Glucose 93.93 mg/dL    Comment: Performed at [facility], [address] [place], [place] [address]  CBC with Differential/Platelet     Status: Abnormal   Collection Time: 12/26/23  1:58 PM  Result Value Ref Range   WBC 6.9 4.0 - 10.5 K/uL   RBC 4.35 3.87 - 5.11 MIL/uL   Hemoglobin 11.9 (L) 12.0 - 15.0 g/dL   HCT 19.1 (L) 47.8 - 29.5 %   MCV 80.0 80.0 - 100.0 fL   MCH 27.4 26.0 - 34.0 pg   MCHC 34.2 30.0 - 36.0 g/dL   RDW 62.1 30.8 - 65.7 %   Platelets 272 150 - 400 K/uL   nRBC 0.0 0.0 - 0.2 %   Neutrophils Relative % 61 %   Neutro Abs 4.2 1.7 - 7.7 K/uL   Lymphocytes Relative 30 %   Lymphs Abs 2.1 0.7 - 4.0 K/uL   Monocytes Relative 7 %   Monocytes Absolute 0.5 0.1 - 1.0 K/uL   Eosinophils Relative 1 %   Eosinophils Absolute 0.1 0.0 - 0.5 K/uL   Basophils Relative 1 %   Basophils Absolute 0.0 0.0 - 0.1 K/uL   Immature Granulocytes 0 %   Abs Immature Granulocytes 0.03 0.00 - 0.07 K/uL     Comment: Performed at [facility], [address], [place], [place] [address]  Comprehensive metabolic panel     Status: Abnormal   Collection Time: 12/26/23  1:58 PM  Result Value Ref Range   Sodium 136 135 - 145 mmol/L   Potassium 4.0 3.5 - 5.1 mmol/L   Chloride 104 98 - 111 mmol/L   CO2 24 22 - 32 mmol/L   Glucose, Bld 115 (H) 70 - 99 mg/dL    Comment: Glucose reference range applies only to samples taken after fasting for at least 8 hours.   BUN 8 6 - 20 mg/dL   Creatinine, Ser 2.95 0.44 - 1.00 mg/dL   Calcium 9.2 8.9 - 28.4 mg/dL   Total Protein 6.5 6.5 - 8.1 g/dL   Albumin 3.4 (L) 3.5 - 5.0 g/dL   AST 27 15 - 41 U/L   ALT 35 0 - 44 U/L   Alkaline Phosphatase 46 38 - 126 U/L   Total Bilirubin 0.4 0.0 - 1.2 mg/dL   GFR, Estimated >13 >24 mL/min    Comment: (NOTE) Calculated using the CKD-EPI Creatinine Equation (2021)    Anion gap 8 5 - 15    Comment: Performed at [facility], [address] [place], [place] [address]  hCG, quantitative, pregnancy     Status: Abnormal   Collection Time: 12/26/23  1:58 PM  Result Value Ref Range   hCG, Beta Chain, Quant, S 26,363 (H) <5 mIU/mL    Comment:          GEST. AGE      CONC.  (mIU/mL)   <=1 WEEK        5 - 50     2 WEEKS       50 - 500     3 WEEKS       100 - 10,000     4 WEEKS  1,000 - 30,000     5 WEEKS     3,500 - 115,000   6-8 WEEKS     12,000 - 270,000    12 WEEKS     15,000 - 220,000        FEMALE AND NON-PREGNANT FEMALE:     LESS THAN 5 mIU/mL Performed at [facility], [address] [place], [place] [address]   HIV Antibody (routine testing w rflx)     Status: None   Collection Time: 12/26/23  1:58 PM  Result Value Ref Range   HIV Screen 4th Generation wRfx Non Reactive Non Reactive    Comment: Performed at [facility], [address], [place], [place] [address]  Wet prep, genital     Status: Abnormal   Collection Time: 12/26/23  2:44 PM  Result Value Ref Range   Yeast Wet Prep HPF  POC NONE SEEN NONE SEEN   Trich, Wet Prep NONE SEEN NONE SEEN   Clue Cells Wet Prep HPF POC PRESENT (A) NONE SEEN   WBC, Wet Prep HPF POC <10 <10   Sperm NONE SEEN     Comment: Performed at [facility], [address] [place], [place] [address]    US OB LESS THAN 14 WEEKS WITH [place] TRANSVAGINAL Result Date: 12/26/2023 CLINICAL DATA:  Vaginal bleeding affecting early pregnancy. EXAM: OBSTETRIC <14 WK [place] AND TRANSVAGINAL OB US TECHNIQUE: Both transabdominal and transvaginal ultrasound examinations were performed for complete evaluation of the gestation as well as the maternal uterus, adnexal regions, and pelvic cul-de-sac. Transvaginal technique was performed to assess early pregnancy. COMPARISON:  None available this pregnancy FINDINGS: Intrauterine gestational sac: Single Yolk sac:  Visualized. Embryo:  Visualized. Cardiac Activity: Visualized. Heart Rate: Difficult to capture, but demonstrated on cine clips. CRL:  2.7 mm   5 w   6 d                  [place] EDC: 08/21/2024 Subchorionic hemorrhage:  Small, 10 x 4 x 14 mm Maternal uterus/adnexae: Both ovaries are visualized and are normal. Physiologic corpus luteal cyst in the left ovary. No adnexal mass. No pelvic free fluid. Technically difficult and limited exam due to habitus. IMPRESSION: 1. Single live intrauterine pregnancy estimated gestational age [redacted] weeks 6 days based on crown-rump length for ultrasound [facility] 08/21/2024. 2. Small subchorionic hemorrhage. Electronically Signed   By: Narda Rutherford M.D.   On: 12/26/2023 17:39     Review of Systems  Constitutional:  Negative for fever.  Gastrointestinal:  Positive for abdominal pain.  Genitourinary:  Positive for vaginal bleeding. Negative for dysuria.   Physical Exam   Blood pressure 123/68, pulse 97, temperature 98.3 F (36.8 C), temperature source Oral, resp. rate 16, height 5\' 2"  (1.575 m), weight (!) 148.9 kg, SpO2 99%, unknown if currently breastfeeding.  Physical Exam Constitutional:       General: She is not in acute distress.    Appearance: Normal appearance. She is not ill-appearing, toxic-appearing or diaphoretic.  Neurological:     Mental Status: She is alert and oriented to person, place, and time.  Psychiatric:        Behavior: Behavior normal.    MAU Course  Procedures  MDM  B positive blood type  Patient was seen and evaluated in triage CBC, CMP, HCG, ABO, [place]  Reviewed [place] in detail with the patient.   Assessment and Plan   1. Bacterial vaginosis   2. Vaginal  bleeding in pregnancy   3. Subchorionic hemorrhage in first trimester      Dc home Rx: Flagyl Pelvic rest Keep your OB appt. With [facility] Return to MAU if symptoms worsen  Elevated glucose on CMP, A1c collected and normal.   Duane Lope, NP 12/26/2023 8:38 PM

## 2023-12-26 NOTE — MAU Note (Signed)
.  Heather Ingram is a 23 y.o. at Unknown here in MAU reporting: Vaginal bleeding, vaginal pain, abdominal pain, and rectal pain. She reports the vaginal bleeding began last night and the other sx's have been intermittent for one week now but worsened last night. She denies current pain. She reports last night her bleeding was between light and moderate. She reports today she has only been experiencing spotting. Denies vaginal itching. Reports an odor. Denies recent IC.   Has not had an [place] yet or proof of pregnancy.  LMP: 11/06/2023 Onset of complaint: One week Pain score: Denies current pain.  Vitals:   12/26/23 1335  BP: 123/68  Pulse: 97  Resp: 16  Temp: 98.3 F (36.8 C)  SpO2: 99%      FHT: n/a Lab orders placed from triage: POCT Preg

## 2023-12-26 NOTE — MAU Note (Signed)
 Called by provider, not in lobby

## 2023-12-29 LAB — GC/CHLAMYDIA PROBE AMP (~~LOC~~) NOT AT ARMC
Chlamydia: NEGATIVE
Comment: NEGATIVE
Comment: NORMAL
Neisseria Gonorrhea: NEGATIVE

## 2024-01-14 LAB — OB RESULTS CONSOLE RUBELLA ANTIBODY, IGM: Rubella: IMMUNE

## 2024-01-14 LAB — OB RESULTS CONSOLE HEPATITIS B SURFACE ANTIGEN: Hepatitis B Surface Ag: NEGATIVE

## 2024-01-14 LAB — HEPATITIS C ANTIBODY: HCV Ab: NEGATIVE

## 2024-03-01 ENCOUNTER — Other Ambulatory Visit: Payer: Self-pay | Admitting: Obstetrics and Gynecology

## 2024-03-01 DIAGNOSIS — O99212 Obesity complicating pregnancy, second trimester: Secondary | ICD-10-CM

## 2024-04-07 ENCOUNTER — Other Ambulatory Visit: Payer: Self-pay | Admitting: *Deleted

## 2024-04-07 ENCOUNTER — Ambulatory Visit: Admitting: Maternal & Fetal Medicine

## 2024-04-07 ENCOUNTER — Ambulatory Visit: Attending: Obstetrics and Gynecology

## 2024-04-07 VITALS — BP 106/68 | HR 92

## 2024-04-07 DIAGNOSIS — Z3A21 21 weeks gestation of pregnancy: Secondary | ICD-10-CM

## 2024-04-07 DIAGNOSIS — O99210 Obesity complicating pregnancy, unspecified trimester: Secondary | ICD-10-CM | POA: Diagnosis present

## 2024-04-07 DIAGNOSIS — O09292 Supervision of pregnancy with other poor reproductive or obstetric history, second trimester: Secondary | ICD-10-CM | POA: Diagnosis not present

## 2024-04-07 DIAGNOSIS — O99342 Other mental disorders complicating pregnancy, second trimester: Secondary | ICD-10-CM | POA: Diagnosis not present

## 2024-04-07 DIAGNOSIS — O99213 Obesity complicating pregnancy, third trimester: Secondary | ICD-10-CM | POA: Diagnosis not present

## 2024-04-07 DIAGNOSIS — Z3A20 20 weeks gestation of pregnancy: Secondary | ICD-10-CM | POA: Insufficient documentation

## 2024-04-07 DIAGNOSIS — E669 Obesity, unspecified: Secondary | ICD-10-CM | POA: Diagnosis not present

## 2024-04-07 DIAGNOSIS — O99212 Obesity complicating pregnancy, second trimester: Secondary | ICD-10-CM | POA: Diagnosis present

## 2024-04-07 DIAGNOSIS — F419 Anxiety disorder, unspecified: Secondary | ICD-10-CM

## 2024-04-07 DIAGNOSIS — Z362 Encounter for other antenatal screening follow-up: Secondary | ICD-10-CM

## 2024-04-07 MED ORDER — ASPIRIN 81 MG PO TBEC: 81.0000 mg | DELAYED_RELEASE_TABLET | Freq: Every day | ORAL | 12 refills | Status: DC

## 2024-04-07 NOTE — Progress Notes (Signed)
 Patient information  Patient Name: Heather Ingram  Patient MRN:   [id number]  Referring practice: MFM Referring Provider: [facility])  Problem List   Patient Active Problem List   Diagnosis Date Noted   Preeclampsia, third trimester 07/23/2022   BMI 50.0-59.9, adult (HCC) 02/01/2022   ASCUS with positive high risk human papillomavirus of vagina 01/12/2022   Maternal morbid obesity, antepartum (HCC) 01/05/2022   History of depression 01/05/2022   History of anxiety 01/05/2022    Maternal Fetal Medicine Consult Heather Ingram is a 23 y.o. G2P1001 at [redacted]w[redacted]d here for ultrasound and consultation. She had Low risk of a female fetus. Carrier screening was not done. Maternal serum AFPwas not drawn to my knowledge. She has no acute concerns. Today we focused on the following:   Elevated BMI: I discussed the potential complications associated with obesity in pregnancy.  These complications include but are not limited to increased risk of excessive maternal weight gain, fetal growth abnormalities, fetal congenital disorders, inability to visualize fetal anatomic structures on ultrasound, gestational diabetes, hypertensive disorders of pregnancy, operative birth including cesarean delivery or assisted vaginal delivery, delayed wound healing and many long-term health complications.  I discussed the need for continued growth ultrasounds and possibly antenatal testing depending upon how the pregnancy course progresses.  Maternal weight gain should be limited to 10 to 20 pounds during the pregnancy.  While normal weight loss may occur during the first and early second trimester, efforts to actively lose weight with the use of medication is not recommended during pregnancy.  A whole food diet and regular exercise of at least 15 to 30 minutes of moderately strenuous activity is recommended in the absence of any contraindications. Weight loss with the use of medications is  not recommended during pregnancy.  If other existing comorbidities are present then 81 mg of aspirin should be considered for preeclampsia risk reduction. I discussed the risk and impact of preeclampsia on her pregnancy and the role of baseline laboratory assessment of kidney, liver and platelet count as well as the role of low dose Aspirin to the reduce the risk of developing preeclampsia. I reassured the patient that we expect a favorable pregnancy outcome but due to her pregnancy complications she will need a higher level of monitoring for her pregnancy compared to a pregnancy without complications. The patient had time to ask questions that were answered to her satisfaction. She verbalized understanding of our discussion and agreed to proceed with the plan outlined in the recommendations.   Sonographic findings Single intrauterine pregnancy at 21w 0d  Fetal cardiac activity:  Observed and appears normal. Presentation: Transverse, head to maternal left. The anatomic structures that were well seen appear normal without evidence of soft markers. Due to poor acoustic windows some structures remain suboptimally visualized. Fetal biometry shows the estimated fetal weight at the 30 percentile.  Amniotic fluid: Within normal limits.  MVP: 4.63 cm. Placenta: Anterior. Adnexa: No abnormality visualized. Cervical length: 3.2 cm.  There are limitations of prenatal ultrasound such as the inability to detect certain abnormalities due to poor visualization. Various factors such as fetal position, gestational age and maternal body habitus may increase the difficulty in visualizing the fetal anatomy.    Recommendations - EDD should be 08/18/2024 based on  Early Ultrasound  (01/14/24). - Anatomy ultrasound was done today with the above findings (see report). - Aspirin 81-162 mg from 12 weeks and continued throughout the pregnancy for preeclampsia prophylaxis. - Baseline  labs: CMP, CBC, urine protein creatinine  ratio. - Blood pressure goal of < 140 systolic and < 90 diastolic. Antihypertensive medication should be added/adjusted until BP goal is achieved.  - Early Glucola screening (or A1c of patient declines). - Serial growth ultrasounds every 4-6 weeks. - Antenatal testing (usually weekly BPP or NST) weekly at 34 weeks until delivery. - Delivery likely around 39-[redacted] weeks gestation or sooner if indicated.  Review of Systems: A review of systems was performed and was negative except per HPI   Past Obstetrical History:  OB History  Gravida Para Term Preterm AB Living  2 1 1   1   SAB IAB Ectopic Multiple Live Births     0 1    # Outcome Date GA Lbr Len/2nd Weight Sex Type Anes PTL Lv  2 Current           1 Term 07/24/22 [redacted]w[redacted]d 05:17 / 01:07 7 lb 12.5 oz (3.53 kg) F Vag-Spont EPI  LIV     Past Medical History:  Past Medical History:  Diagnosis Date   Anxiety    Depression    Medical history non-contributory    Pregnancy induced hypertension    Supervision of normal first pregnancy, antepartum 12/28/2021   .Aaron Aas          Nursing Staff    Provider      Office Location     [facility]    Dating     LMP      [facility] Model    [x]  Traditional  [ ]  Centering  [ ]  Mom-Baby Dyad                Language     English    Anatomy US      Normal, but suboptimal views. F/U scheduled      Flu Vaccine     no    Genetic/Carrier Screen     NIPS:   Low risk female  AFP:   negative  Horizon:      TDaP Vaccine          Hgb A1C or   G   UTI (urinary tract infection)    Vitamin D deficiency      Past Surgical History:    Past Surgical History:  Procedure Laterality Date   WISDOM TOOTH EXTRACTION       Home Medications:   Current Outpatient Medications on File Prior to Visit  Medication Sig Dispense Refill   Cholecalciferol (VITAMIN D3) 50 MCG (2000 UT) capsule Take 4,000 Units by mouth daily.     Prenatal Vit-Fe Fumarate-FA (PRENATAL MULTIVITAMIN) TABS tablet Take 1 tablet by mouth daily at 12 noon.     No current  facility-administered medications on file prior to visit.      Allergies:   No Known Allergies   Physical Exam:   Vitals:   04/07/24 1023  BP: 106/68  Pulse: 92   Sitting comfortably on the sonogram table Nonlabored breathing Normal rate and rhythm Abdomen is nontender  Thank you for the opportunity to be involved with this patient's care. Please let us  know if we can be of any further assistance.   30 minutes of time was spent reviewing the patient's chart including labs, imaging and documentation.  At least 50% of this time was spent with direct patient care discussing the diagnosis, management and prognosis of her care.  Eviana Gayle Kava MFM, [place]   04/07/2024  11:31 AM

## 2024-05-05 LAB — OB RESULTS CONSOLE RPR: RPR: NONREACTIVE

## 2024-05-19 ENCOUNTER — Ambulatory Visit: Attending: Maternal & Fetal Medicine

## 2024-05-19 ENCOUNTER — Other Ambulatory Visit: Payer: Self-pay | Admitting: *Deleted

## 2024-05-19 ENCOUNTER — Ambulatory Visit: Admitting: Maternal & Fetal Medicine

## 2024-05-19 DIAGNOSIS — Z6841 Body Mass Index (BMI) 40.0 and over, adult: Secondary | ICD-10-CM

## 2024-05-19 DIAGNOSIS — Z3A27 27 weeks gestation of pregnancy: Secondary | ICD-10-CM | POA: Diagnosis not present

## 2024-05-19 DIAGNOSIS — O99210 Obesity complicating pregnancy, unspecified trimester: Secondary | ICD-10-CM | POA: Diagnosis present

## 2024-05-19 DIAGNOSIS — O99212 Obesity complicating pregnancy, second trimester: Secondary | ICD-10-CM | POA: Diagnosis not present

## 2024-05-19 DIAGNOSIS — F419 Anxiety disorder, unspecified: Secondary | ICD-10-CM | POA: Diagnosis not present

## 2024-05-19 DIAGNOSIS — O09299 Supervision of pregnancy with other poor reproductive or obstetric history, unspecified trimester: Secondary | ICD-10-CM

## 2024-05-19 DIAGNOSIS — E669 Obesity, unspecified: Secondary | ICD-10-CM | POA: Diagnosis not present

## 2024-05-19 DIAGNOSIS — O99342 Other mental disorders complicating pregnancy, second trimester: Secondary | ICD-10-CM

## 2024-05-19 DIAGNOSIS — Z362 Encounter for other antenatal screening follow-up: Secondary | ICD-10-CM | POA: Insufficient documentation

## 2024-05-19 DIAGNOSIS — O09292 Supervision of pregnancy with other poor reproductive or obstetric history, second trimester: Secondary | ICD-10-CM

## 2024-05-19 NOTE — Progress Notes (Signed)
   Patient information  Patient Name: Heather Ingram  Patient MRN:   [id number]  Referring practice: MFM Referring Provider: [facility])  Problem List   Patient Active Problem List   Diagnosis Date Noted   Preeclampsia, third trimester 07/23/2022   BMI 50.0-59.9, adult (HCC) 02/01/2022   ASCUS with positive high risk human papillomavirus of vagina 01/12/2022   Maternal morbid obesity, antepartum (HCC) 01/05/2022   History of depression 01/05/2022   History of anxiety 01/05/2022    Maternal Fetal medicine Consult  Milan A RODRIGUEZ RAMOS is a 23 y.o. G2P1001 at [redacted]w[redacted]d here for ultrasound and consultation. Davonna A RODRIGUEZ RAMOS is doing well today with no acute concerns. Today we focused on the following:   Due to elevated BMI future growth US  and antenatal testing will be done. She failed the 1h GTT and a three 3h GTT is indicated.   Sonographic findings Single intrauterine pregnancy at 27w 0d.  Fetal cardiac activity:  Observed and appears normal. Presentation: Compound. Interval fetal anatomy appears normal. Fetal biometry shows the estimated fetal weight at the 65 percentile. Amniotic fluid volume: Within normal limits. MVP: 5.38 cm. Placenta: Anterior.  There are limitations of prenatal ultrasound such as the inability to detect certain abnormalities due to poor visualization. Various factors such as fetal position, gestational age and maternal body habitus may increase the difficulty in visualizing the fetal anatomy.    Recommendations 1. Serial growth ultrasounds every 4-6 weeks until delivery 2. Antenatal testing to start around 34 weeks  3. 3h GTT  Review of Systems: A review of systems was performed and was negative except per HPI   Vitals and Physical Exam    05/19/2024    2:01 PM 04/07/2024   10:23 AM 12/26/2023    1:35 PM  Vitals with BMI  Height   5' 2  Weight   328 lbs 5 oz  BMI   60.03  Systolic 127 106 876  Diastolic 64  68 68  Pulse 101 92 97    Sitting comfortably on the sonogram table Nonlabored breathing Normal rate and rhythm Abdomen is nontender  Past pregnancies OB History  Gravida Para Term Preterm AB Living  2 1 1   1   SAB IAB Ectopic Multiple Live Births     0 1    # Outcome Date GA Lbr Len/2nd Weight Sex Type Anes PTL Lv  2 Current           1 Term 07/24/22 [redacted]w[redacted]d 05:17 / 01:07 7 lb 12.5 oz (3.53 kg) F Vag-Spont EPI  LIV     I spent 20 minutes reviewing the patients chart, including labs and images as well as counseling the patient about her medical conditions. Greater than 50% of the time was spent in direct face-to-face patient counseling.  Delora Smaller  MFM, [place]   05/19/2024  4:16 PM

## 2024-06-30 ENCOUNTER — Ambulatory Visit

## 2024-07-05 ENCOUNTER — Other Ambulatory Visit: Payer: Self-pay | Admitting: Maternal & Fetal Medicine

## 2024-07-05 ENCOUNTER — Other Ambulatory Visit: Payer: Self-pay | Admitting: *Deleted

## 2024-07-05 ENCOUNTER — Ambulatory Visit: Attending: Maternal & Fetal Medicine

## 2024-07-05 ENCOUNTER — Ambulatory Visit (HOSPITAL_BASED_OUTPATIENT_CLINIC_OR_DEPARTMENT_OTHER): Admitting: Obstetrics

## 2024-07-05 VITALS — BP 104/71 | HR 100

## 2024-07-05 DIAGNOSIS — O09299 Supervision of pregnancy with other poor reproductive or obstetric history, unspecified trimester: Secondary | ICD-10-CM

## 2024-07-05 DIAGNOSIS — O99213 Obesity complicating pregnancy, third trimester: Secondary | ICD-10-CM | POA: Diagnosis present

## 2024-07-05 DIAGNOSIS — Z6841 Body Mass Index (BMI) 40.0 and over, adult: Secondary | ICD-10-CM

## 2024-07-05 DIAGNOSIS — Z3A33 33 weeks gestation of pregnancy: Secondary | ICD-10-CM | POA: Diagnosis not present

## 2024-07-05 DIAGNOSIS — E669 Obesity, unspecified: Secondary | ICD-10-CM | POA: Diagnosis not present

## 2024-07-05 DIAGNOSIS — O09293 Supervision of pregnancy with other poor reproductive or obstetric history, third trimester: Secondary | ICD-10-CM | POA: Diagnosis not present

## 2024-07-05 DIAGNOSIS — O1493 Unspecified pre-eclampsia, third trimester: Secondary | ICD-10-CM | POA: Insufficient documentation

## 2024-07-05 DIAGNOSIS — Z3689 Encounter for other specified antenatal screening: Secondary | ICD-10-CM | POA: Insufficient documentation

## 2024-07-05 DIAGNOSIS — O99210 Obesity complicating pregnancy, unspecified trimester: Secondary | ICD-10-CM | POA: Insufficient documentation

## 2024-07-05 NOTE — Progress Notes (Signed)
 MFM Consult Note  Heather Ingram is currently at 33 weeks and 5 days.  She has been followed due to maternal obesity with an elevated BMI of 57.4.  She denies any problems since her last exam and has screened negative for gestational diabetes in her current pregnancy.  Sonographic findings Single intrauterine pregnancy. Fetal cardiac activity: Observed. Presentation: Cephalic. Interval fetal anatomy appears normal. Fetal biometry shows the estimated fetal weight of 5 pounds 5 ounces which measures at the 62nd percentile. Amniotic fluid: Within normal limits.  MVP: 7.72 cm. Placenta: Anterior. BPP: 8/8.   There are limitations of prenatal ultrasound such as the inability to detect certain abnormalities due to poor visualization. Various factors such as fetal position, gestational age and maternal body habitus may increase the difficulty in visualizing the fetal anatomy.    Due to her elevated BMI, we will continue to follow her with weekly fetal testing until delivery.    Delivery should occur at around 39 weeks.    She will return in 1 week for another BPP.    The patient stated that all of her questions were answered today.  A total of 20 minutes was spent counseling and coordinating the care for this patient.  Greater than 50% of the time was spent in direct face-to-face contact.

## 2024-07-07 ENCOUNTER — Other Ambulatory Visit

## 2024-07-10 ENCOUNTER — Inpatient Hospital Stay (HOSPITAL_COMMUNITY)
Admission: AD | Admit: 2024-07-10 | Discharge: 2024-07-11 | Disposition: A | Discharge status: Home / Self Care | Attending: Obstetrics and Gynecology | Admitting: Obstetrics and Gynecology

## 2024-07-10 DIAGNOSIS — H538 Other visual disturbances: Secondary | ICD-10-CM | POA: Insufficient documentation

## 2024-07-10 DIAGNOSIS — R030 Elevated blood-pressure reading, without diagnosis of hypertension: Secondary | ICD-10-CM | POA: Insufficient documentation

## 2024-07-10 DIAGNOSIS — O26893 Other specified pregnancy related conditions, third trimester: Secondary | ICD-10-CM | POA: Insufficient documentation

## 2024-07-10 DIAGNOSIS — Z3689 Encounter for other specified antenatal screening: Secondary | ICD-10-CM | POA: Insufficient documentation

## 2024-07-10 DIAGNOSIS — Z3A34 34 weeks gestation of pregnancy: Secondary | ICD-10-CM | POA: Insufficient documentation

## 2024-07-11 ENCOUNTER — Inpatient Hospital Stay (HOSPITAL_COMMUNITY)
Admission: AD | Admit: 2024-07-11 | Discharge: 2024-07-11 | Disposition: A | Discharge status: Home / Self Care | Attending: Obstetrics and Gynecology | Admitting: Obstetrics and Gynecology

## 2024-07-11 ENCOUNTER — Encounter (HOSPITAL_COMMUNITY): Payer: Self-pay | Admitting: Obstetrics and Gynecology

## 2024-07-11 ENCOUNTER — Other Ambulatory Visit: Payer: Self-pay

## 2024-07-11 DIAGNOSIS — Z3A34 34 weeks gestation of pregnancy: Secondary | ICD-10-CM

## 2024-07-11 DIAGNOSIS — Z3689 Encounter for other specified antenatal screening: Secondary | ICD-10-CM | POA: Diagnosis not present

## 2024-07-11 DIAGNOSIS — R030 Elevated blood-pressure reading, without diagnosis of hypertension: Secondary | ICD-10-CM | POA: Diagnosis not present

## 2024-07-11 DIAGNOSIS — O26893 Other specified pregnancy related conditions, third trimester: Secondary | ICD-10-CM

## 2024-07-11 DIAGNOSIS — R519 Headache, unspecified: Secondary | ICD-10-CM | POA: Insufficient documentation

## 2024-07-11 DIAGNOSIS — H538 Other visual disturbances: Secondary | ICD-10-CM | POA: Diagnosis not present

## 2024-07-11 LAB — PROTEIN / CREATININE RATIO, URINE
Creatinine, Urine: 210 mg/dL
Protein Creatinine Ratio: 0.11 mg/mg{creat} (ref 0.00–0.15)
Total Protein, Urine: 23 mg/dL

## 2024-07-11 LAB — COMPREHENSIVE METABOLIC PANEL WITH GFR
ALT: 22 U/L (ref 0–44)
AST: 22 U/L (ref 15–41)
Albumin: 2.6 g/dL — ABNORMAL LOW (ref 3.5–5.0)
Alkaline Phosphatase: 137 U/L — ABNORMAL HIGH (ref 38–126)
Anion gap: 10 (ref 5–15)
BUN: 7 mg/dL (ref 6–20)
CO2: 18 mmol/L — ABNORMAL LOW (ref 22–32)
Calcium: 8.8 mg/dL — ABNORMAL LOW (ref 8.9–10.3)
Chloride: 108 mmol/L (ref 98–111)
Creatinine, Ser: 0.62 mg/dL (ref 0.44–1.00)
GFR, Estimated: >60 mL/min (ref >60)
Glucose, Bld: 114 mg/dL — ABNORMAL HIGH (ref 70–99)
Potassium: 4.1 mmol/L (ref 3.5–5.1)
Sodium: 136 mmol/L (ref 135–145)
Total Bilirubin: 0.3 mg/dL (ref 0.0–1.2)
Total Protein: 6.7 g/dL (ref 6.5–8.1)

## 2024-07-11 LAB — URINALYSIS, ROUTINE W REFLEX MICROSCOPIC
Bilirubin Urine: NEGATIVE
Glucose, UA: NEGATIVE mg/dL
Hgb urine dipstick: NEGATIVE
Ketones, ur: NEGATIVE mg/dL
Nitrite: NEGATIVE
Protein, ur: 30 mg/dL — AB
Specific Gravity, Urine: 1.028 (ref 1.005–1.030)
pH: 8 (ref 5.0–8.0)

## 2024-07-11 LAB — CBC
HCT: 34.3 % — ABNORMAL LOW (ref 36.0–46.0)
Hemoglobin: 11.3 g/dL — ABNORMAL LOW (ref 12.0–15.0)
MCH: 25.4 pg — ABNORMAL LOW (ref 26.0–34.0)
MCHC: 32.9 g/dL (ref 30.0–36.0)
MCV: 77.1 fL — ABNORMAL LOW (ref 80.0–100.0)
Platelets: 288 K/uL (ref 150–400)
RBC: 4.45 MIL/uL (ref 3.87–5.11)
RDW: 13.1 % (ref 11.5–15.5)
WBC: 9.1 K/uL (ref 4.0–10.5)
nRBC: 0 % (ref 0.0–0.2)

## 2024-07-11 NOTE — MAU Provider Note (Signed)
 History     CSN: [id number]  Arrival date and time: 07/10/24 2354   Event Date/Time   First Provider Initiated Contact with Patient 07/11/24 0040      Chief Complaint  Patient presents with   Hypertension   Headache   vision changes   Heather Ingram is a 23 y.o. G2P1001 at [redacted]w[redacted]d who receives care at [facility].  She reports her next appt is in 2 weeks on Wednesday. She presents today for HTN, HA, and Blurry Vision.  She reports her blurry vision has been ongoing since beginning of 3rd trimester. She states she took her bp at home, around 2130, and it was 146/90 and she repeated ~10-15 minutes later with 148/84 . She states her HA started around 1330 and was worse throughout the day.  She states it has calmed down, but states she doesn't take medicine and contributes this to why it has lingered. She reports at it's worse the HA was a 8/10 and is now a 5/10.    OB History     Gravida  2   Para  1   Term  1   Preterm      AB      Living  1      SAB      IAB      Ectopic      Multiple  0   Live Births  1           Past Medical History:  Diagnosis Date   Anxiety    Depression    Medical history non-contributory    Pregnancy induced hypertension    Supervision of normal first pregnancy, antepartum 12/28/2021   .SABRA          Nursing Staff    Provider      Office Location     [facility]    Dating     LMP      [facility] Model    [x]  Traditional  [ ]  Centering  [ ]  Mom-Baby Dyad                Language     English    Anatomy US      Normal, but suboptimal views. F/U scheduled      Flu Vaccine     no    Genetic/Carrier Screen     NIPS:   Low risk female  AFP:   negative  Horizon:      TDaP Vaccine          Hgb A1C or   G   UTI (urinary tract infection)    Vitamin D deficiency     Past Surgical History:  Procedure Laterality Date   WISDOM TOOTH EXTRACTION      Family History  Problem Relation Age of Onset   Diabetes Maternal Aunt    Healthy Mother     Healthy Father     Social History   Tobacco Use   Smoking status: Never   Smokeless tobacco: Never  Vaping Use   Vaping status: Former   Start date: 10/21/2018   Quit date: 12/09/2021   Substances: Nicotine  Substance Use Topics   Alcohol use: Not Currently   Drug use: Never    Allergies: No Known Allergies  Medications Prior to Admission  Medication Sig Dispense Refill Last Dose/Taking   Prenatal Vit-Fe Fumarate-FA (PRENATAL MULTIVITAMIN) TABS tablet Take 1 tablet by mouth daily at 12 noon.   07/10/2024   aspirin   EC 81 MG tablet Take 1 tablet (81 mg total) by mouth daily. Swallow whole. (Patient not taking: Reported on 07/11/2024) 30 tablet 12 Not Taking   Cholecalciferol (VITAMIN D3) 50 MCG (2000 UT) capsule Take 4,000 Units by mouth daily. (Patient not taking: Reported on 07/11/2024)   Not Taking    Review of Systems  Eyes:  Positive for visual disturbance (Blurry).  Gastrointestinal:  Negative for constipation, diarrhea, nausea and vomiting.  Genitourinary:  Negative for difficulty urinating, dysuria, vaginal bleeding and vaginal discharge.  Neurological:  Positive for headaches. Negative for dizziness and light-headedness.   Physical Exam   Blood pressure 134/68, pulse (!) 102, temperature 99.1 F (37.3 C), temperature source Oral, resp. rate 18, height 5' 2 (1.575 m), weight (!) 151.5 kg, last menstrual period 10/29/2023, SpO2 96%, unknown if currently breastfeeding. Vitals:   07/11/24 0007 07/11/24 0029 07/11/24 0032 07/11/24 0047  BP: (!) 141/92 136/64 134/68 (!) 105/48   07/11/24 0102 07/11/24 0116  BP: (!) 79/45 (!) 123/94    Physical Exam Vitals and nursing note reviewed.  Constitutional:      Appearance: Normal appearance. She is well-developed. She is obese.  HENT:     Head: Normocephalic and atraumatic.  Eyes:     Conjunctiva/sclera: Conjunctivae normal.  Cardiovascular:     Rate and Rhythm: Normal rate.  Pulmonary:     Effort: Pulmonary effort is  normal. No respiratory distress.  Abdominal:     Palpations: Abdomen is soft.     Tenderness: There is no abdominal tenderness.     Comments: Gravid, Appears LGA  Musculoskeletal:        General: Normal range of motion.     Cervical back: Normal range of motion.  Skin:    General: Skin is warm and dry.  Neurological:     Mental Status: She is alert and oriented to person, place, and time.     Cranial Nerves: No cranial nerve deficit.     Sensory: No sensory deficit.     Motor: No weakness.     Coordination: Coordination is intact.  Psychiatric:        Mood and Affect: Mood normal.        Behavior: Behavior normal.     Fetal Assessment 150 bpm, Mod Var, -Decels, +Accels Toco: No ctx graphed  MAU Course   Results for orders placed or performed during the hospital encounter of 07/10/24 (from the past 24 hours)  Urinalysis, Routine w reflex microscopic -Urine, Clean Catch     Status: Abnormal   Collection Time: 07/11/24 12:17 AM  Result Value Ref Range   Color, Urine AMBER (A) YELLOW   APPearance CLOUDY (A) CLEAR   Specific Gravity, Urine 1.028 1.005 - 1.030   pH 8.0 5.0 - 8.0   Glucose, UA NEGATIVE NEGATIVE mg/dL   Hgb urine dipstick NEGATIVE NEGATIVE   Bilirubin Urine NEGATIVE NEGATIVE   Ketones, ur NEGATIVE NEGATIVE mg/dL   Protein, ur 30 (A) NEGATIVE mg/dL   Nitrite NEGATIVE NEGATIVE   Leukocytes,Ua SMALL (A) NEGATIVE   RBC / HPF 0-5 0 - 5 RBC/hpf   WBC, UA 21-50 0 - 5 WBC/hpf   Bacteria, UA MANY (A) NONE SEEN   Squamous Epithelial / HPF 6-10 0 - 5 /HPF   Mucus PRESENT    Uric Acid Crys, UA PRESENT   Protein / creatinine ratio, urine     Status: None   Collection Time: 07/11/24 12:17 AM  Result Value Ref Range   Creatinine,  Urine 210 mg/dL   Total Protein, Urine 23 mg/dL   Protein Creatinine Ratio 0.11 0.00 - 0.15 mg/mg[Cre]  CBC     Status: Abnormal   Collection Time: 07/11/24 12:30 AM  Result Value Ref Range   WBC 9.1 4.0 - 10.5 K/uL   RBC 4.45 3.87 - 5.11  MIL/uL   Hemoglobin 11.3 (L) 12.0 - 15.0 g/dL   HCT 65.6 (L) 63.9 - 53.9 %   MCV 77.1 (L) 80.0 - 100.0 fL   MCH 25.4 (L) 26.0 - 34.0 pg   MCHC 32.9 30.0 - 36.0 g/dL   RDW 86.8 88.4 - 84.4 %   Platelets 288 150 - 400 K/uL   nRBC 0.0 0.0 - 0.2 %  Comprehensive metabolic panel with GFR     Status: Abnormal   Collection Time: 07/11/24 12:30 AM  Result Value Ref Range   Sodium 136 135 - 145 mmol/L   Potassium 4.1 3.5 - 5.1 mmol/L   Chloride 108 98 - 111 mmol/L   CO2 18 (L) 22 - 32 mmol/L   Glucose, Bld 114 (H) 70 - 99 mg/dL   BUN 7 6 - 20 mg/dL   Creatinine, Ser 9.37 0.44 - 1.00 mg/dL   Calcium 8.8 (L) 8.9 - 10.3 mg/dL   Total Protein 6.7 6.5 - 8.1 g/dL   Albumin 2.6 (L) 3.5 - 5.0 g/dL   AST 22 15 - 41 U/L   ALT 22 0 - 44 U/L   Alkaline Phosphatase 137 (H) 38 - 126 U/L   Total Bilirubin 0.3 0.0 - 1.2 mg/dL   GFR, Estimated >39 >39 mL/min   Anion gap 10 5 - 15   No results found.   MDM Physical Exam Labs: CBC, CMP, PC Ratio Measure BPQ15 min EFM Coordination of Follow UP Assessment and Plan   23 year old G2P1001  SIUP at 34.4 weeks Cat I FT HTN Headache  Visual Disturbances-Chronic  -Labs ordered while patient in triage. -Reviewed POC. -Exam performed. -Patient offered and declines pain medication. -Informed that if labs return negative will discharge with arrangement of close follow up. -Patient verbalizes understanding and without concerns.  -Monitor and await results.    Harlene LITTIE Duncans MSN, CNM 07/11/2024, 12:40 AM   Reassessment (1:20 AM) -Labs return as above. -No findings suggestive of PreE. -Nurse to give strict precautions. -Patient to call office on Monday to schedule for BP check later this week.  -Encouraged to call primary office or return to MAU if symptoms worsen or with the onset of new symptoms. -Discharged to home in stable condition.  Harlene LITTIE Duncans MSN, CNM Advanced Practice Provider, [facility] [employer]

## 2024-07-11 NOTE — MAU Note (Signed)
 MAU Triage Note: Heather Ingram is a 23 y.o. at [redacted]w[redacted]d here in MAU reporting: elevated BP of 146/90 and 148/84 at home. Started having black dots in her vision along with a headache that started today and wouldn't go away - has not taken tylenol . She also reports on-going swelling in her feet that began around 3-4 weeks ago. Denies VB or watery LOF. Reports +FM.   Patient complaint: HBP  Pain Score: 5  Pain Location: Head     PIH Assessment: Headache present: Yes ;No treatment attempted Visual disturbances: Floaters RUQ pain/Epigastric: None Atypical edema: ; BLE Hx of HBP: Hx of Pre-E in previous pregnancy BP Medications: None prescribed  Onset of complaint: today LMP: Patient's last menstrual period was 10/29/2023.  Vitals:   07/11/24 0007  BP: (!) 141/92  Pulse: 95  Resp: 18  Temp: 99.1 F (37.3 C)  SpO2: 98%    FHT:  Fetal Heart Rate Mode: Doppler Baseline Rate (A): 140 bpm Multiple birth?: No Lab orders placed from triage: UA

## 2024-07-11 NOTE — MAU Note (Signed)
 Heather Ingram is a 23 y.o. at [redacted]w[redacted]d here in MAU reporting: here early this morning for HTN and HA.  HA is worse now.  Hx of Pre E with first prg.  Has been checking BP at home, has been elevated.  Still having blurred vision, and black spots.  Feet have been swollen on and off during preg.  Denies epigastric pain, has had pain all over abd.  Has not taken anything for the HA, doesn't like to take meds when pregnant.  Denies bleeding or LOF. Reports +FM  Onset of complaint: last night Pain score: 7 Vitals:   07/11/24 1744  BP: 129/71  Pulse: 97  Resp: 20  Temp: 98.5 F (36.9 C)  SpO2: 100%     FHT:164 Lab orders placed from triage:  urine collected

## 2024-07-11 NOTE — MAU Provider Note (Signed)
 History     CSN: [id number]  Arrival date and time: 07/11/24 1721   Event Date/Time   First Provider Initiated Contact with Patient 07/11/24 1859      Chief Complaint  Patient presents with   Headache   Hypertension   HPI Heather Ingram is a 23 y.o. year old G17P1001 female at [redacted]w[redacted]d weeks gestation who presents to MAU reporting she was in MAU last night with a H/A and high blood pressure. She reports her H/A is worse than it was when she was here last night. She has not taken any medication for the H/A, because I don't take medicine when I'm pregnant. She reports elevated BP at home. She reports blurry vision and seeing spots, intermittent swelling in feet during pregnancy. She reports some abdominal pain; not epigastric. She denies VB and LOF. She reports (+) FM. She came today for a 2nd opinion, because she and her mother-in-law reviewed her labs on MyChart. They were concerned that she was d/c'd home with abnormal labs values. She receives [facility]. Her spouse is present and contributing to the history taking.   OB History     Gravida  2   Para  1   Term  1   Preterm      AB      Living  1      SAB      IAB      Ectopic      Multiple  0   Live Births  1           Past Medical History:  Diagnosis Date   Anxiety    Depression    Medical history non-contributory    Pregnancy induced hypertension    Supervision of normal first pregnancy, antepartum 12/28/2021   .SABRA          Nursing Staff    Provider      Office Location     [facility]    Dating     LMP      [facility] Model    [x]  Traditional  [ ]  Centering  [ ]  Mom-Baby Dyad                Language     English    Anatomy US      Normal, but suboptimal views. F/U scheduled      Flu Vaccine     no    Genetic/Carrier Screen     NIPS:   Low risk female  AFP:   negative  Horizon:      TDaP Vaccine          Hgb A1C or   G   UTI (urinary tract infection)    Vitamin D deficiency      Past Surgical History:  Procedure Laterality Date   WISDOM TOOTH EXTRACTION      Family History  Problem Relation Age of Onset   Diabetes Maternal Aunt    Healthy Mother    Healthy Father     Social History   Tobacco Use   Smoking status: Never   Smokeless tobacco: Never  Vaping Use   Vaping status: Former   Start date: 10/21/2018   Quit date: 12/09/2021   Substances: Nicotine  Substance Use Topics   Alcohol use: Not Currently   Drug use: Never    Allergies: No Known Allergies  Medications Prior to Admission  Medication Sig Dispense Refill Last Dose/Taking   aspirin  EC  81 MG tablet Take 1 tablet (81 mg total) by mouth daily. Swallow whole. (Patient not taking: Reported on 07/11/2024) 30 tablet 12    Cholecalciferol (VITAMIN D3) 50 MCG (2000 UT) capsule Take 4,000 Units by mouth daily. (Patient not taking: Reported on 07/11/2024)      Prenatal Vit-Fe Fumarate-FA (PRENATAL MULTIVITAMIN) TABS tablet Take 1 tablet by mouth daily at 12 noon.       Review of Systems  Constitutional: Negative.   HENT: Negative.    Eyes:  Positive for visual disturbance.  Respiratory: Negative.    Cardiovascular: Negative.   Gastrointestinal:  Positive for abdominal pain.  Endocrine: Negative.   Genitourinary: Negative.   Musculoskeletal: Negative.   Skin: Negative.   Allergic/Immunologic: Negative.   Neurological:  Positive for headaches.  Hematological: Negative.   Psychiatric/Behavioral: Negative.     Physical Exam   Blood pressure 115/78, pulse 92, temperature 98.5 F (36.9 C), temperature source Oral, resp. rate 20, height 5' 2 (1.575 m), weight (!) 151 kg, last menstrual period 10/29/2023, SpO2 100%, unknown if currently breastfeeding.  Physical Exam Vitals and nursing note reviewed.  Constitutional:      Appearance: Normal appearance. She is obese.  Cardiovascular:     Rate and Rhythm: Normal rate.  Pulmonary:     Effort: Pulmonary effort is normal.  Abdominal:      Palpations: Abdomen is soft.  Genitourinary:    Comments: not indicated Musculoskeletal:        General: Normal range of motion.  Skin:    General: Skin is warm and dry.  Neurological:     Mental Status: She is alert and oriented to person, place, and time.  Psychiatric:        Mood and Affect: Mood normal.        Behavior: Behavior normal.        Thought Content: Thought content normal.        Judgment: Judgment normal.    REACTIVE NST - FHR: 135 bpm / moderate variability / accels present / decels absent / TOCO: none MAU Course  Procedures  MDM Reviewed PEC labs from MAU visit earlier today Offered pain medication for H/A -- patient declined  Results for orders placed or performed during the hospital encounter of 07/10/24 (from the past 24 hours)  Urinalysis, Routine w reflex microscopic -Urine, Clean Catch     Status: Abnormal   Collection Time: 07/11/24 12:17 AM  Result Value Ref Range   Color, Urine AMBER (A) YELLOW   APPearance CLOUDY (A) CLEAR   Specific Gravity, Urine 1.028 1.005 - 1.030   pH 8.0 5.0 - 8.0   Glucose, UA NEGATIVE NEGATIVE mg/dL   Hgb urine dipstick NEGATIVE NEGATIVE   Bilirubin Urine NEGATIVE NEGATIVE   Ketones, ur NEGATIVE NEGATIVE mg/dL   Protein, ur 30 (A) NEGATIVE mg/dL   Nitrite NEGATIVE NEGATIVE   Leukocytes,Ua SMALL (A) NEGATIVE   RBC / HPF 0-5 0 - 5 RBC/hpf   WBC, UA 21-50 0 - 5 WBC/hpf   Bacteria, UA MANY (A) NONE SEEN   Squamous Epithelial / HPF 6-10 0 - 5 /HPF   Mucus PRESENT    Uric Acid Crys, UA PRESENT   Protein / creatinine ratio, urine     Status: None   Collection Time: 07/11/24 12:17 AM  Result Value Ref Range   Creatinine, Urine 210 mg/dL   Total Protein, Urine 23 mg/dL   Protein Creatinine Ratio 0.11 0.00 - 0.15 mg/mg[Cre]  CBC  Status: Abnormal   Collection Time: 07/11/24 12:30 AM  Result Value Ref Range   WBC 9.1 4.0 - 10.5 K/uL   RBC 4.45 3.87 - 5.11 MIL/uL   Hemoglobin 11.3 (L) 12.0 - 15.0 g/dL   HCT 65.6 (L)  63.9 - 46.0 %   MCV 77.1 (L) 80.0 - 100.0 fL   MCH 25.4 (L) 26.0 - 34.0 pg   MCHC 32.9 30.0 - 36.0 g/dL   RDW 86.8 88.4 - 84.4 %   Platelets 288 150 - 400 K/uL   nRBC 0.0 0.0 - 0.2 %  Comprehensive metabolic panel with GFR     Status: Abnormal   Collection Time: 07/11/24 12:30 AM  Result Value Ref Range   Sodium 136 135 - 145 mmol/L   Potassium 4.1 3.5 - 5.1 mmol/L   Chloride 108 98 - 111 mmol/L   CO2 18 (L) 22 - 32 mmol/L   Glucose, Bld 114 (H) 70 - 99 mg/dL   BUN 7 6 - 20 mg/dL   Creatinine, Ser 9.37 0.44 - 1.00 mg/dL   Calcium 8.8 (L) 8.9 - 10.3 mg/dL   Total Protein 6.7 6.5 - 8.1 g/dL   Albumin 2.6 (L) 3.5 - 5.0 g/dL   AST 22 15 - 41 U/L   ALT 22 0 - 44 U/L   Alkaline Phosphatase 137 (H) 38 - 126 U/L   Total Bilirubin 0.3 0.0 - 1.2 mg/dL   GFR, Estimated >39 >39 mL/min   Anion gap 10 5 - 15    Assessment and Plan  1. Headache in pregnancy, antepartum, third trimester (Primary)  2. Elevated blood pressure reading in office without diagnosis of hypertension - Normal BP values in MAU   3. [redacted] weeks gestation of pregnancy  - Discharge home - Keep scheduled appt with [facility] - Patient verbalized an understanding of the plan of care and agrees.   Ala Cart, CNM 07/11/2024, 6:59 PM

## 2024-07-12 ENCOUNTER — Ambulatory Visit (HOSPITAL_BASED_OUTPATIENT_CLINIC_OR_DEPARTMENT_OTHER): Attending: Obstetrics | Admitting: Obstetrics

## 2024-07-12 ENCOUNTER — Ambulatory Visit (HOSPITAL_BASED_OUTPATIENT_CLINIC_OR_DEPARTMENT_OTHER)

## 2024-07-12 VITALS — BP 107/55

## 2024-07-12 DIAGNOSIS — O99213 Obesity complicating pregnancy, third trimester: Secondary | ICD-10-CM | POA: Insufficient documentation

## 2024-07-12 DIAGNOSIS — O09293 Supervision of pregnancy with other poor reproductive or obstetric history, third trimester: Secondary | ICD-10-CM | POA: Diagnosis not present

## 2024-07-12 DIAGNOSIS — E669 Obesity, unspecified: Secondary | ICD-10-CM | POA: Diagnosis not present

## 2024-07-12 DIAGNOSIS — Z362 Encounter for other antenatal screening follow-up: Secondary | ICD-10-CM | POA: Insufficient documentation

## 2024-07-12 DIAGNOSIS — O09299 Supervision of pregnancy with other poor reproductive or obstetric history, unspecified trimester: Secondary | ICD-10-CM

## 2024-07-12 DIAGNOSIS — Z3A34 34 weeks gestation of pregnancy: Secondary | ICD-10-CM

## 2024-07-12 DIAGNOSIS — O1493 Unspecified pre-eclampsia, third trimester: Secondary | ICD-10-CM

## 2024-07-12 DIAGNOSIS — Z6841 Body Mass Index (BMI) 40.0 and over, adult: Secondary | ICD-10-CM

## 2024-07-12 NOTE — Progress Notes (Signed)
 MFM Consult Note  Heather Ingram is currently at 34 weeks and 5 days.  She has been followed due to maternal obesity with a BMI of 57.4. The patient went to the MAU twice yesterday due to a headache and concerns regarding preeclampsia.    Her blood pressures in the MAU were within normal limits and her PIH labs were also within normal limits.  Her P/C ratio was 0.11 indicating that she does not have significant proteinuria and therefore has ruled out for preeclampsia.    The patient reports that she is feeling better today.  Her blood pressure today was 107/55.  A biophysical profile performed today was 8/8.  The AFI was 18.8 cm.   The fetus was in the vertex presentation.  The patient was reassured that based on her normal labs and normal P/C ratio, she does not have preeclampsia at this time.    However due to her history of preeclampsia in her prior pregnancy, she may still be at risk for developing preeclampsia later in her current pregnancy.    Preeclampsia precautions were reviewed today.    Due to maternal obesity, we will continue to follow her with weekly fetal testing until delivery.    Delivery is recommended at around 39 weeks.    She will return in 1 week for another BPP.    The patient stated that all of her questions were answered today.  A total of 20 minutes was spent counseling and coordinating the care for this patient.  Greater than 50% of the time was spent in direct face-to-face contact.

## 2024-07-14 ENCOUNTER — Other Ambulatory Visit

## 2024-07-21 ENCOUNTER — Ambulatory Visit: Attending: Obstetrics | Admitting: Obstetrics

## 2024-07-21 ENCOUNTER — Ambulatory Visit

## 2024-07-21 DIAGNOSIS — Z6841 Body Mass Index (BMI) 40.0 and over, adult: Secondary | ICD-10-CM

## 2024-07-21 DIAGNOSIS — O358XX0 Maternal care for other (suspected) fetal abnormality and damage, not applicable or unspecified: Secondary | ICD-10-CM

## 2024-07-21 DIAGNOSIS — E669 Obesity, unspecified: Secondary | ICD-10-CM

## 2024-07-21 DIAGNOSIS — O99213 Obesity complicating pregnancy, third trimester: Secondary | ICD-10-CM

## 2024-07-21 DIAGNOSIS — O09293 Supervision of pregnancy with other poor reproductive or obstetric history, third trimester: Secondary | ICD-10-CM

## 2024-07-21 DIAGNOSIS — Z3A36 36 weeks gestation of pregnancy: Secondary | ICD-10-CM | POA: Diagnosis not present

## 2024-07-21 DIAGNOSIS — O09299 Supervision of pregnancy with other poor reproductive or obstetric history, unspecified trimester: Secondary | ICD-10-CM

## 2024-07-21 DIAGNOSIS — O35DXX0 Maternal care for other (suspected) fetal abnormality and damage, fetal gastrointestinal anomalies, not applicable or unspecified: Secondary | ICD-10-CM

## 2024-07-21 DIAGNOSIS — Z362 Encounter for other antenatal screening follow-up: Secondary | ICD-10-CM | POA: Insufficient documentation

## 2024-07-21 LAB — OB RESULTS CONSOLE GBS: GBS: POSITIVE

## 2024-07-21 NOTE — Progress Notes (Signed)
 MFM Consult Note  Heather Ingram is currently at 36 weeks and 0 days.  She has been followed due to maternal obesity with a BMI of 57.4.    She denies any problems since her last exam and reports feeling fetal movements throughout the day.  A biophysical profile performed today was 8/8.  The AFI was 14.04 cm.   The fetus was in the breech presentation.  On today's exam, dilated loops of bowel were noted in the fetal abdomen.  This finding was not noted during her prior ultrasound exams.    The patient was reassured that dilated loops of bowel are often noted in the third trimester and may be a transient finding.    However, dilated loops of bowel may also be a sign of intestinal obstruction or anal atresia.    We will reassess the fetal intestines during her ultrasound exam next week.    Should dilated loops of bowel continue to be noted, we will notify the NICU regarding this finding.  Due to maternal obesity, delivery is recommended at around 39 weeks.,  She already has an induction scheduled on August 11, 2024.  She will return in 1 week for another BPP.    The patient stated that all of her questions were answered today.  A total of 20 minutes was spent counseling and coordinating the care for this patient.  Greater than 50% of the time was spent in direct face-to-face contact.

## 2024-07-22 ENCOUNTER — Other Ambulatory Visit: Payer: Self-pay | Admitting: Obstetrics and Gynecology

## 2024-07-28 ENCOUNTER — Ambulatory Visit

## 2024-07-28 ENCOUNTER — Other Ambulatory Visit: Payer: Self-pay | Admitting: *Deleted

## 2024-07-28 ENCOUNTER — Ambulatory Visit (HOSPITAL_BASED_OUTPATIENT_CLINIC_OR_DEPARTMENT_OTHER): Admitting: Maternal & Fetal Medicine

## 2024-07-28 VITALS — BP 117/77

## 2024-07-28 DIAGNOSIS — Z362 Encounter for other antenatal screening follow-up: Secondary | ICD-10-CM | POA: Insufficient documentation

## 2024-07-28 DIAGNOSIS — E669 Obesity, unspecified: Secondary | ICD-10-CM | POA: Diagnosis not present

## 2024-07-28 DIAGNOSIS — O09299 Supervision of pregnancy with other poor reproductive or obstetric history, unspecified trimester: Secondary | ICD-10-CM

## 2024-07-28 DIAGNOSIS — O09293 Supervision of pregnancy with other poor reproductive or obstetric history, third trimester: Secondary | ICD-10-CM

## 2024-07-28 DIAGNOSIS — O99213 Obesity complicating pregnancy, third trimester: Secondary | ICD-10-CM | POA: Insufficient documentation

## 2024-07-28 DIAGNOSIS — O368130 Decreased fetal movements, third trimester, not applicable or unspecified: Secondary | ICD-10-CM

## 2024-07-28 DIAGNOSIS — Z3A37 37 weeks gestation of pregnancy: Secondary | ICD-10-CM

## 2024-07-28 DIAGNOSIS — O35DXX0 Maternal care for other (suspected) fetal abnormality and damage, fetal gastrointestinal anomalies, not applicable or unspecified: Secondary | ICD-10-CM | POA: Diagnosis not present

## 2024-07-28 NOTE — Progress Notes (Signed)
 Patient information  Patient Name: Heather Ingram  Patient MRN:   [id number]  Referring practice: MFM Referring Provider: [facility] ([facility])  Problem List   Patient Active Problem List   Diagnosis Date Noted   History of pre-eclampsia in prior pregnancy, currently pregnant 07/28/2024   Pregnancy complicated by fetal gastrointestinal abnormality (borderline dilated fetal bowel) 07/28/2024   BMI 50.0-59.9, adult (HCC) 02/01/2022   ASCUS with positive high risk human papillomavirus of vagina 01/12/2022   Maternal morbid obesity, antepartum (HCC) 01/05/2022   History of depression 01/05/2022   History of anxiety 01/05/2022    Maternal Fetal medicine Consult  Heather Ingram is a 23 y.o. G2P1001 at [redacted]w[redacted]d here for ultrasound and consultation. Heather Ingram is doing well today. Today we focused on the following:   Decreased perception of fetal movement: The patient reports that for the last 4 days she has experienced decreased fetal movement.  She reports that the frequency and the intensity of the movement is slightly decreased.  I discussed that the BPP shows adequate fetal movement for this gestational age and is an 8 out of 8 giving a stillbirth rate of approximately 1 out of 1000.  We discussed the limitations of fetal movement but also the importance of assessing this at home and going to the MAU with any concern.  I discussed that early delivery can be completed after 37 to 38 weeks in the setting of decreased fetal movement if it is persistent.  At this time she does not feel like her anxiety is significant enough to pursue delivery but she will talk to her OB provider about this.  I also offered her a nonstress test later this week which will be done Friday.  Borderline dilated fetal bowel: The fetal bowel is at the upper limit of normal today measuring approximately 14 mm in the largest transverse diameter.  This likely represents normal bowel  in the third trimester but if there are any concerns based on the clinical presentation of the newborn after birth imaging should be completed to assess the fetal bowel.  The patient had time to ask questions that were answered to her satisfaction.  She verbalized understanding and agrees to proceed with the plan below.  Sonographic findings Single intrauterine pregnancy. Fetal cardiac activity: Observed. Presentation: Cephalic. Interval fetal anatomy appears normal. The fetal bowel is at the upper limit of normal today measuring approximately 14 mm in the largest transverse diameter.  This likely represents normal bowel in the third trimester starting to feel with meconium but it could represent bowel pathology. Amniotic fluid: Within normal limits.  MVP: 6.34 cm. Placenta: Anterior. BPP: 8/8.   There are limitations of prenatal ultrasound such as the inability to detect certain abnormalities due to poor visualization. Various factors such as fetal position, gestational age and maternal body habitus may increase the difficulty in visualizing the fetal anatomy.    Recommendations - Twice weekly antenatal testing due to decreased fetal movement - Delivery can occur at any time if the patient has extreme maternal anxiety or if antenatal testing is not reassuring.  The patient will talk to her OB provider about an early delivery. Delivery can occur between 37-39 weeks in the setting of decrease fetal movement.   Review of Systems: A review of systems was performed and was negative except per HPI   Vitals and Physical Exam    07/28/2024    7:06 AM 07/21/2024   11:52 AM 07/12/2024  7:17 AM  Vitals with BMI  Systolic 117 122 892  Diastolic 77 79 55  Pulse  88    Sitting comfortably on the sonogram table Nonlabored breathing Normal rate and rhythm Abdomen is nontender  Past pregnancies OB History  Gravida Para Term Preterm AB Living  2 1 1   1   SAB IAB Ectopic Multiple Live Births      0 1    # Outcome Date GA Lbr Len/2nd Weight Sex Type Anes PTL Lv  2 Current           1 Term 07/24/22 [redacted]w[redacted]d 05:17 / 01:07 7 lb 12.5 oz (3.53 kg) F Vag-Spont EPI  LIV     I spent 30 minutes reviewing the patients chart, including labs and images as well as counseling the patient about her medical conditions. Greater than 50% of the time was spent in direct face-to-face patient counseling.  Delora Smaller  MFM, [facility]   07/28/2024  9:14 AM

## 2024-07-29 ENCOUNTER — Other Ambulatory Visit: Payer: Self-pay | Admitting: Obstetrics and Gynecology

## 2024-07-30 ENCOUNTER — Inpatient Hospital Stay (HOSPITAL_COMMUNITY)
Admission: RE | Admit: 2024-07-30 | Discharge: 2024-08-02 | DRG: 807 | Disposition: A | Discharge status: Home / Self Care | Attending: Obstetrics and Gynecology | Admitting: Obstetrics and Gynecology

## 2024-07-30 ENCOUNTER — Other Ambulatory Visit: Payer: Self-pay

## 2024-07-30 ENCOUNTER — Inpatient Hospital Stay (HOSPITAL_COMMUNITY)

## 2024-07-30 ENCOUNTER — Ambulatory Visit

## 2024-07-30 ENCOUNTER — Encounter (HOSPITAL_COMMUNITY): Payer: Self-pay | Admitting: Obstetrics and Gynecology

## 2024-07-30 DIAGNOSIS — Z3A37 37 weeks gestation of pregnancy: Secondary | ICD-10-CM

## 2024-07-30 DIAGNOSIS — O99214 Obesity complicating childbirth: Secondary | ICD-10-CM | POA: Diagnosis present

## 2024-07-30 DIAGNOSIS — O99824 Streptococcus B carrier state complicating childbirth: Secondary | ICD-10-CM | POA: Diagnosis present

## 2024-07-30 DIAGNOSIS — Z833 Family history of diabetes mellitus: Secondary | ICD-10-CM | POA: Diagnosis not present

## 2024-07-30 DIAGNOSIS — O368130 Decreased fetal movements, third trimester, not applicable or unspecified: Secondary | ICD-10-CM | POA: Diagnosis present

## 2024-07-30 DIAGNOSIS — Z3490 Encounter for supervision of normal pregnancy, unspecified, unspecified trimester: Secondary | ICD-10-CM | POA: Diagnosis present

## 2024-07-30 LAB — CBC
HCT: 36 % (ref 36.0–46.0)
Hemoglobin: 11.9 g/dL — ABNORMAL LOW (ref 12.0–15.0)
MCH: 25.1 pg — ABNORMAL LOW (ref 26.0–34.0)
MCHC: 33.1 g/dL (ref 30.0–36.0)
MCV: 75.9 fL — ABNORMAL LOW (ref 80.0–100.0)
Platelets: 280 K/uL (ref 150–400)
RBC: 4.74 MIL/uL (ref 3.87–5.11)
RDW: 13.5 % (ref 11.5–15.5)
WBC: 8.8 K/uL (ref 4.0–10.5)
nRBC: 0 % (ref 0.0–0.2)

## 2024-07-30 LAB — RPR: RPR Ser Ql: NONREACTIVE

## 2024-07-30 LAB — TYPE AND SCREEN
ABO/RH(D): B POS
Antibody Screen: NEGATIVE

## 2024-07-30 MED ORDER — ACETAMINOPHEN 325 MG PO TABS: 650.0000 mg | ORAL_TABLET | ORAL | Status: DC | PRN

## 2024-07-30 MED ORDER — OXYTOCIN-SODIUM CHLORIDE 30-0.9 UT/500ML-% IV SOLN: 1.0000 m[IU]/min | INTRAVENOUS | Status: DC

## 2024-07-30 MED ORDER — TERBUTALINE SULFATE 1 MG/ML IJ SOLN: 0.2500 mg | Freq: Once | INTRAMUSCULAR | Status: DC | PRN

## 2024-07-30 MED ORDER — LACTATED RINGERS IV SOLN: 500.0000 mL | INTRAVENOUS | Status: AC | PRN

## 2024-07-30 MED ORDER — FENTANYL CITRATE (PF) 100 MCG/2ML IJ SOLN
50.0000 ug | INTRAMUSCULAR | Status: DC | PRN
  Administered 2024-07-30 – 2024-07-31 (×3): 100 ug via INTRAVENOUS
  Filled 2024-07-30 (×3): qty 2

## 2024-07-30 MED ORDER — ONDANSETRON HCL 4 MG/2ML IJ SOLN
4.0000 mg | Freq: Four times a day (QID) | INTRAMUSCULAR | Status: DC | PRN
  Administered 2024-07-30: 4 mg via INTRAVENOUS
  Filled 2024-07-30: qty 2

## 2024-07-30 MED ORDER — LACTATED RINGERS IV SOLN: INTRAVENOUS | Status: AC

## 2024-07-30 MED ORDER — OXYTOCIN BOLUS FROM INFUSION
333.0000 mL | Freq: Once | INTRAVENOUS | Status: AC
  Administered 2024-07-31: 333 mL via INTRAVENOUS

## 2024-07-30 MED ORDER — OXYTOCIN-SODIUM CHLORIDE 30-0.9 UT/500ML-% IV SOLN
1.0000 m[IU]/min | INTRAVENOUS | Status: DC
  Administered 2024-07-30: 1 m[IU]/min via INTRAVENOUS
  Filled 2024-07-30: qty 500

## 2024-07-30 MED ORDER — OXYTOCIN-SODIUM CHLORIDE 30-0.9 UT/500ML-% IV SOLN: 2.5000 [IU]/h | INTRAVENOUS | Status: DC

## 2024-07-30 MED ORDER — FLEET ENEMA RE ENEM: 1.0000 | ENEMA | RECTAL | Status: DC | PRN

## 2024-07-30 MED ORDER — SOD CITRATE-CITRIC ACID 500-334 MG/5ML PO SOLN: 30.0000 mL | ORAL | Status: DC | PRN

## 2024-07-30 MED ORDER — PENICILLIN G POT IN DEXTROSE 60000 UNIT/ML IV SOLN: 3.0000 10*6.[IU] | INTRAVENOUS | Status: DC

## 2024-07-30 MED ORDER — PENICILLIN G POTASSIUM 5000000 UNITS IJ SOLR: 5.0000 10*6.[IU] | Freq: Once | INTRAMUSCULAR | Status: DC

## 2024-07-30 MED ORDER — PENICILLIN G POT IN DEXTROSE 60000 UNIT/ML IV SOLN
3.0000 10*6.[IU] | INTRAVENOUS | Status: DC
  Administered 2024-07-30 – 2024-07-31 (×7): 3 10*6.[IU] via INTRAVENOUS
  Filled 2024-07-30 (×11): qty 50

## 2024-07-30 MED ORDER — MISOPROSTOL 25 MCG QUARTER TABLET
25.0000 ug | ORAL_TABLET | ORAL | Status: AC | PRN
  Administered 2024-07-30 (×3): 25 ug via VAGINAL
  Filled 2024-07-30 (×3): qty 1

## 2024-07-30 MED ORDER — LIDOCAINE HCL (PF) 1 % IJ SOLN: 30.0000 mL | INTRAMUSCULAR | Status: DC | PRN

## 2024-07-30 MED ORDER — SODIUM CHLORIDE 0.9 % IV SOLN
5.0000 10*6.[IU] | Freq: Once | INTRAVENOUS | Status: AC
  Administered 2024-07-30: 5 10*6.[IU] via INTRAVENOUS
  Filled 2024-07-30: qty 5

## 2024-07-30 NOTE — Progress Notes (Addendum)
 Pt's chart reviewed and MFM recommends IOL d/t persistent decreased fetal movement once pt is 37wks at pt's discretion.  Pt informed Orie Bonus, CNM at her last visit that she wanted to proceed with IOL.  Pt is currently 37 2/7wks. FHT cat 1 and reactive with baseline 130 (tracing rebviewed remotely) Toco occas Cvx per Tinnie, RN closed Orders placed for vaginal cytotec  x 3 doses followed b y low dose pitocin  per protocol GBS + - pcn ordered Prenatal Transfer Tool to included mention of borderline fetal dilated bowels per MFM Discussed pt with Mercer Peal, CNM who will enter H&P

## 2024-07-30 NOTE — H&P (Signed)
 OB ADMISSION/ HISTORY & PHYSICAL:  Admission Date: 07/30/2024  6:04 AM  Admit Diagnosis: Decreased fetal movement  Heather Ingram is a 23 y.o. female G28P1001 [redacted]w[redacted]d presenting for induction of labor. MFM recommends IOL d/t persistent complaints of decreased fetal movement. Heather Ingram has a hx of unstable lie: cephalic at 33 wks, breech at 35 wks, cephalic on admission. Pt endorses active FM, denies LOF and vaginal bleeding. Dilated loops of fetal bowel noted on US  at 36 wks.   History of current pregnancy: G2P1001   Patient entered care with [facility] at 9 wks.   EDC 08/18/24 by 9+5 wk U/S.   Anatomy scan:  complete w/ anterior placenta.   Antenatal testing: for BMI of 60 started at 34 weeks Last evaluation:  33+5 wks vertex/ anterior placenta/ AFI 20/ EFW 5+5 (62%) 36 wks breech presentation/ anterior placenta/ AFI 14  Significant prenatal events:  Maternal BMI of 61 Hx of pre-eclampsia Unstable lie  Prenatal Labs: ABO, Rh: --/--/B POS (10/10 9285) Antibody: NEG (10/10 0714) Rubella: Immune (03/26 0000)  RPR: NON REACTIVE (10/10 0714)  HBsAg: Negative (03/26 0000)  HIV: Non Reactive (03/07 1358)  GTT: normal 3 hr gtt GBS: Positive/-- (10/01 0000)  GC/CHL: neg/neg Genetics: low-risk female Vaccines: Tdap: declined Influenza: declined   OB History  Gravida Para Term Preterm AB Living  2 1 1   1   SAB IAB Ectopic Multiple Live Births     0 1    # Outcome Date GA Lbr Len/2nd Weight Sex Type Anes PTL Lv  2 Current           1 Term 07/24/22 [redacted]w[redacted]d 05:17 / 01:07 3530 g F Vag-Spont EPI  LIV    Medical / Surgical History: Past medical history:  Past Medical History:  Diagnosis Date   Anxiety    Depression    Medical history non-contributory    Preeclampsia, third trimester 07/23/2022   Pregnancy induced hypertension    Supervision of normal first pregnancy, antepartum 12/28/2021   .SABRA          Nursing Staff    Provider      Office Location     [facility]     Dating     LMP      [facility] Model    [x]  Traditional  [ ]  Centering  [ ]  Mom-Baby Dyad                Language     English    Anatomy US      Normal, but suboptimal views. F/U scheduled      Flu Vaccine     no    Genetic/Carrier Screen     NIPS:   Low risk female  AFP:   negative  Horizon:      TDaP Vaccine          Hgb A1C or   G   UTI (urinary tract infection)    Vitamin D deficiency     Past surgical history:  Past Surgical History:  Procedure Laterality Date   WISDOM TOOTH EXTRACTION     Family History:  Family History  Problem Relation Age of Onset   Diabetes Maternal Aunt    Healthy Mother    Healthy Father     Social History:  reports that she has never smoked. She has never used smokeless tobacco. She reports that she does not currently use alcohol. She reports that she does not use drugs.  Allergies: Patient  has no known allergies.   Review of Systems: Constitutional: Negative   HENT: Negative   Eyes: Negative   Respiratory: Negative   Cardiovascular: Negative   Gastrointestinal: Negative  Genitourinary: neg for bloody show, neg for LOF   Musculoskeletal: Negative   Skin: Negative   Neurological: Negative   Endo/Heme/Allergies: Negative   Psychiatric/Behavioral: Negative    Physical Exam: VS: Blood pressure 103/64, pulse 76, temperature 98.9 F (37.2 C), temperature source Axillary, resp. rate 18, height 5' 2 (1.575 m), weight (!) 153.2 kg, last menstrual period 10/29/2023, unknown if currently breastfeeding. AAO x3, no signs of distress Cardiovascular: RRR Respiratory: Unlabored GU/GI: Abdomen gravid, non-tender, non-distended, active FM, vertex  Extremities: 1+ non-pitting edema, negative for pain, tenderness, and cords  Cervical exam:Dilation: 1 Effacement (%): 50 Station: -3 Exam by:: Nicholaus, RN FHR: baseline rate 135 / variability moderate / accelerations present / absent decelerations TOCO: none   Prenatal Transfer Tool  Maternal Diabetes: No Genetic  Screening: Normal Maternal Ultrasounds/Referrals: Normal Fetal Ultrasounds or other Referrals:  Other:   Borderline dilated fetal bowel: The fetal bowel is at the upper  limit of normal today measuring approximately 14 mm in the  largest transverse diameter.  Maternal Substance Abuse:  No Significant Maternal Medications:  None Significant Maternal Lab Results: Group B Strep negative Number of Prenatal Visits:greater than 3 verified prenatal visits Maternal Vaccinations:declined  Other Comments:  None    Assessment: 23 y.o. G2P1001 [redacted]w[redacted]d  Induction of labor for persistent decreased fetal movement Maternal BMI 60 FHR category 1 GBS positive Pain management plan: IV sedation   Plan:  Admit to L&D Routine admission orders Epidural PRN PCN for GBS prophylaxis Cytotec  for cervical ripening Dr Henry notified of admission and plan of care  Heather KATHEE Peal DNP, CNM 07/30/2024 5:39 PM

## 2024-07-30 NOTE — Progress Notes (Signed)
 Subjective:    Discussed cervical balloon placement and pt agrees.   Objective:    VS: BP 135/65   Pulse 77   Temp 98.2 F (36.8 C) (Oral)   Resp 18   Ht 5' 2 (1.575 m)   Wt (!) 153.2 kg   LMP 10/29/2023   BMI 61.77 kg/m  FHR : baseline 135 / variability moderate / accelerations present / absent decelerations Toco: irregular Membranes: intact Dilation: 1.5 Effacement (%): 80 Cervical Position: Middle Station: -3 Presentation: Vertex Exam by:: Mercer CNM  Assessment/Plan:   23 y.o. G2P1001 [redacted]w[redacted]d IOL for persistent decreased FM Maternal BMI 61  Labor: S/P Cytotec  25 mcg PV x 3 doses, followed by cervical balloon (80 ml cervical and 60 mL vaginal)  Fetal Wellbeing:  Category I I/D:  GBS pos, PCN prophylaxis Anticipated MOD:  NSVD  Saranne Crislip B Carlisle Torgeson DNP, CNM 07/30/2024 11:13 PM

## 2024-07-30 NOTE — Anesthesia Preprocedure Evaluation (Addendum)
 Anesthesia Evaluation  Patient identified by MRN, date of birth, ID band Patient awake    Reviewed: Allergy & Precautions, Patient's Chart, lab work & pertinent test results  History of Anesthesia Complications Negative for: history of anesthetic complications  Airway Mallampati: III  TM Distance: >3 FB Neck ROM: Full    Dental no notable dental hx. (+) Teeth Intact, Dental Advisory Given   Pulmonary neg pulmonary ROS   Pulmonary exam normal breath sounds clear to auscultation       Cardiovascular hypertension, Normal cardiovascular exam Rhythm:Regular Rate:Normal     Neuro/Psych   Anxiety Depression    negative neurological ROS  negative psych ROS   GI/Hepatic negative GI ROS, Neg liver ROS,,,  Endo/Other    Class 4 obesity  Renal/GU negative Renal ROS  negative genitourinary   Musculoskeletal negative musculoskeletal ROS (+)    Abdominal  (+) + obese  Peds  Hematology negative hematology ROS (+) Lab Results      Component                Value               Date                      WBC                      8.8                 07/30/2024                HGB                      11.9 (L)            07/30/2024                HCT                      36.0                07/30/2024                MCV                      75.9 (L)            07/30/2024                PLT                      280                 07/30/2024              Anesthesia Other Findings Day of surgery medications reviewed with patient.  Reproductive/Obstetrics (+) Pregnancy                              Anesthesia Physical Anesthesia Plan  ASA: 4  Anesthesia Plan: Epidural   Post-op Pain Management:    Induction:   PONV Risk Score and Plan: Treatment may vary due to age or medical condition  Airway Management Planned: Natural Airway  Additional Equipment: Fetal Monitoring  Intra-op Plan:    Post-operative Plan:   Informed Consent: I have reviewed the patients History and Physical, chart, labs and discussed  the procedure including the risks, benefits and alternatives for the proposed anesthesia with the patient or authorized representative who has indicated his/her understanding and acceptance.       Plan Discussed with:   Anesthesia Plan Comments: (37.3 wk G2P1 w BMI of 61.8 for LEA)         Anesthesia Quick Evaluation

## 2024-07-30 NOTE — Progress Notes (Signed)
 Plan of care to place cervical balloon at next check discussed w/ RN.  135/ moderate/ accels present/ decels absent  23 y.o. G2P1001 [redacted]w[redacted]d IOL for persistent decreased FM Maternal BMI 61   Labor: S/P Cytotec  25 mcg PV x 3 doses Fetal Wellbeing:  Category I I/D:  GBS pos, PCN prophylaxis Anticipated MOD:  NSVD

## 2024-07-31 ENCOUNTER — Encounter (HOSPITAL_COMMUNITY): Payer: Self-pay | Admitting: Obstetrics and Gynecology

## 2024-07-31 ENCOUNTER — Inpatient Hospital Stay (HOSPITAL_COMMUNITY): Payer: Self-pay | Admitting: Anesthesiology

## 2024-07-31 MED ORDER — METHYLERGONOVINE MALEATE 0.2 MG PO TABS: 0.2000 mg | ORAL_TABLET | ORAL | Status: DC | PRN

## 2024-07-31 MED ORDER — DIPHENHYDRAMINE HCL 25 MG PO CAPS: 25.0000 mg | ORAL_CAPSULE | Freq: Four times a day (QID) | ORAL | Status: DC | PRN

## 2024-07-31 MED ORDER — DIPHENHYDRAMINE HCL 50 MG/ML IJ SOLN: 12.5000 mg | INTRAMUSCULAR | Status: DC | PRN

## 2024-07-31 MED ORDER — COCONUT OIL OIL
1.0000 | TOPICAL_OIL | Status: DC | PRN
  Administered 2024-07-31: 1 via TOPICAL

## 2024-07-31 MED ORDER — PRENATAL MULTIVITAMIN CH
1.0000 | ORAL_TABLET | Freq: Every day | ORAL | Status: DC
  Administered 2024-08-01 – 2024-08-02 (×2): 1 via ORAL
  Filled 2024-07-31 (×2): qty 1

## 2024-07-31 MED ORDER — OXYCODONE HCL 5 MG PO TABS: 10.0000 mg | ORAL_TABLET | ORAL | Status: DC | PRN

## 2024-07-31 MED ORDER — OXYCODONE HCL 5 MG PO TABS: 5.0000 mg | ORAL_TABLET | ORAL | Status: DC | PRN

## 2024-07-31 MED ORDER — WITCH HAZEL-GLYCERIN EX PADS: 1.0000 | MEDICATED_PAD | CUTANEOUS | Status: DC | PRN

## 2024-07-31 MED ORDER — SENNOSIDES-DOCUSATE SODIUM 8.6-50 MG PO TABS
2.0000 | ORAL_TABLET | Freq: Every day | ORAL | Status: DC
  Filled 2024-07-31 (×2): qty 2

## 2024-07-31 MED ORDER — EPHEDRINE 5 MG/ML INJ: 10.0000 mg | INTRAVENOUS | Status: DC | PRN

## 2024-07-31 MED ORDER — ONDANSETRON HCL 4 MG/2ML IJ SOLN: 4.0000 mg | INTRAMUSCULAR | Status: DC | PRN

## 2024-07-31 MED ORDER — LACTATED RINGERS IV SOLN: 500.0000 mL | INTRAVENOUS | Status: DC | PRN

## 2024-07-31 MED ORDER — FENTANYL-BUPIVACAINE-NACL 0.5-0.125-0.9 MG/250ML-% EP SOLN
12.0000 mL/h | EPIDURAL | Status: DC | PRN
  Administered 2024-07-31: 12 mL/h via EPIDURAL
  Filled 2024-07-31: qty 250

## 2024-07-31 MED ORDER — ACETAMINOPHEN 325 MG PO TABS: 650.0000 mg | ORAL_TABLET | ORAL | Status: DC | PRN

## 2024-07-31 MED ORDER — TRANEXAMIC ACID-NACL 1000-0.7 MG/100ML-% IV SOLN
1000.0000 mg | INTRAVENOUS | Status: AC
  Administered 2024-07-31: 1000 mg via INTRAVENOUS

## 2024-07-31 MED ORDER — DIBUCAINE (PERIANAL) 1 % EX OINT: 1.0000 | TOPICAL_OINTMENT | CUTANEOUS | Status: DC | PRN

## 2024-07-31 MED ORDER — ZOLPIDEM TARTRATE 5 MG PO TABS: 5.0000 mg | ORAL_TABLET | Freq: Every evening | ORAL | Status: DC | PRN

## 2024-07-31 MED ORDER — LIDOCAINE HCL (PF) 1 % IJ SOLN
INTRAMUSCULAR | Status: DC | PRN
Start: 2024-07-31 — End: 2024-07-31
  Administered 2024-07-31: 11 mL via EPIDURAL

## 2024-07-31 MED ORDER — LACTATED RINGERS IV SOLN
500.0000 mL | Freq: Once | INTRAVENOUS | Status: AC
  Administered 2024-07-31: 500 mL via INTRAVENOUS

## 2024-07-31 MED ORDER — IBUPROFEN 600 MG PO TABS
600.0000 mg | ORAL_TABLET | Freq: Four times a day (QID) | ORAL | Status: DC
  Administered 2024-07-31 – 2024-08-02 (×8): 600 mg via ORAL
  Filled 2024-07-31 (×9): qty 1

## 2024-07-31 MED ORDER — ONDANSETRON HCL 4 MG PO TABS
4.0000 mg | ORAL_TABLET | ORAL | Status: DC | PRN
Start: 2024-07-31 — End: 2024-08-02

## 2024-07-31 MED ORDER — LACTATED RINGERS IV SOLN: INTRAVENOUS | Status: DC

## 2024-07-31 MED ORDER — SIMETHICONE 80 MG PO CHEW: 80.0000 mg | CHEWABLE_TABLET | ORAL | Status: DC | PRN

## 2024-07-31 MED ORDER — PHENYLEPHRINE 80 MCG/ML (10ML) SYRINGE FOR IV PUSH (FOR BLOOD PRESSURE SUPPORT): 80.0000 ug | PREFILLED_SYRINGE | INTRAVENOUS | Status: DC | PRN

## 2024-07-31 MED ORDER — METHYLERGONOVINE MALEATE 0.2 MG/ML IJ SOLN: 0.2000 mg | INTRAMUSCULAR | Status: DC | PRN

## 2024-07-31 MED ORDER — TRANEXAMIC ACID-NACL 1000-0.7 MG/100ML-% IV SOLN
INTRAVENOUS | Status: AC
  Filled 2024-07-31: qty 100

## 2024-07-31 MED ORDER — BENZOCAINE-MENTHOL 20-0.5 % EX AERO
1.0000 | INHALATION_SPRAY | CUTANEOUS | Status: DC | PRN
  Administered 2024-07-31: 1 via TOPICAL
  Filled 2024-07-31: qty 56

## 2024-07-31 NOTE — Anesthesia Procedure Notes (Signed)
 Epidural Patient location during procedure: OB Start time: 07/31/2024 7:42 AM End time: 07/31/2024 7:57 AM  Staffing Anesthesiologist: Cleotilde Butler Dade, MD Performed: anesthesiologist   Preanesthetic Checklist Completed: patient identified, IV checked, site marked, risks and benefits discussed, surgical consent, monitors and equipment checked, pre-op evaluation and timeout performed  Epidural Patient position: sitting Prep: ChloraPrep Patient monitoring: heart rate, cardiac monitor, continuous pulse ox and blood pressure Approach: midline Location: L2-L3 Injection technique: LOR saline  Needle:  Needle type: Tuohy  Needle gauge: 17 G Needle length: 9 cm Needle insertion depth: 9 cm Catheter type: closed end flexible Catheter size: 20 Guage Catheter at skin depth: 15 cm Test dose: negative  Assessment Events: blood not aspirated, injection not painful, no injection resistance, no paresthesia and negative IV test  Additional Notes Reason for block:procedure for pain

## 2024-07-31 NOTE — Progress Notes (Signed)
 Subjective:    Coping well w/ ctx. Discussed interruptions in fetal tracing w/ position changed. Offered amniotomy and internal monitoring and pt agrees to both.   Objective:    VS: BP 129/84   Pulse 80   Temp 98.1 F (36.7 C) (Axillary)   Resp 18   Ht 5' 2 (1.575 m)   Wt (!) 153.2 kg   LMP 10/29/2023   BMI 61.77 kg/m  FHR : baseline 140 / variability moderate / accelerations present / absent decelerations IUPC: contractions every 2-6 minutes  Membranes: AROM, copius, clear fluid Dilation: 5 Effacement (%): 80 Cervical Position: Middle Station: -3 Presentation: Vertex Exam by:: LULLA Peal CNM Pitocin  6 mU/min  Assessment/Plan:   23 y.o. G2P1001 [redacted]w[redacted]d  Labor: S/P Cytotec  25 mcg PV x 3 doses, cervical balloon w/ Pitocin , now AROM, continue to titrate Pitocin  to obtain MVU 120-200 Fetal Wellbeing:  Category I Pain Control:  per pt's request I/D:  GBS pos, continue PCN prophylaxis Anticipated MOD:  NSVD  Mercer KATHEE Peal DNP, CNM 07/31/2024 7:10 AM

## 2024-07-31 NOTE — Progress Notes (Signed)
 In to introduce myself to the patient  Subjective:  She is comfortable with her epidural.   Objective: BP 122/74   Pulse 85   Temp 97.7 F (36.5 C) (Axillary)   Resp 20   Ht 5' 2 (1.575 m)   Wt (!) 153.2 kg   LMP 10/29/2023   SpO2 99%   BMI 61.77 kg/m  No intake/output data recorded. No intake/output data recorded.  FHT:  FHR: 120 bpm, variability: moderate,  accelerations:  Present,  decelerations:  Absent UC:   regular, every 2 minutes SVE:   Dilation: 5 Effacement (%): 80 Station: -3 Exam by:: [employer]: Lab Results  Component Value Date   WBC 8.8 07/30/2024   HGB 11.9 (L) 07/30/2024   HCT 36.0 07/30/2024   MCV 75.9 (L) 07/30/2024   PLT 280 07/30/2024    Assessment / Plan: Induction of labor due to persistent decreased fetal movement,  progressing well on pitocin   Labor: prolonged latent phase. Continue pitocin  recheck in 2 hours  Preeclampsia:  NA Fetal Wellbeing:  Category I Pain Control:  Epidural I/D:  Penicillin  Anticipated MOD:  NSVD  Rexene Hoit, MD 07/31/2024, 10:01 AM
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
## 2024-08-01 DIAGNOSIS — Z3490 Encounter for supervision of normal pregnancy, unspecified, unspecified trimester: Secondary | ICD-10-CM | POA: Diagnosis present

## 2024-08-01 LAB — CBC
HCT: 30.2 % — ABNORMAL LOW (ref 36.0–46.0)
Hemoglobin: 10 g/dL — ABNORMAL LOW (ref 12.0–15.0)
MCH: 25.3 pg — ABNORMAL LOW (ref 26.0–34.0)
MCHC: 33.1 g/dL (ref 30.0–36.0)
MCV: 76.5 fL — ABNORMAL LOW (ref 80.0–100.0)
Platelets: 231 K/uL (ref 150–400)
RBC: 3.95 MIL/uL (ref 3.87–5.11)
RDW: 13.6 % (ref 11.5–15.5)
WBC: 8.5 K/uL (ref 4.0–10.5)
nRBC: 0 % (ref 0.0–0.2)

## 2024-08-01 NOTE — Anesthesia Postprocedure Evaluation (Signed)
 Anesthesia Post Note  Patient: Heather Ingram  Procedure(s) Performed: AN AD HOC LABOR EPIDURAL     Patient location during evaluation: Mother Baby Anesthesia Type: Epidural Level of consciousness: awake, awake and alert and oriented Pain management: satisfactory to patient Vital Signs Assessment: post-procedure vital signs reviewed and stable Respiratory status: spontaneous breathing, respiratory function stable and nonlabored ventilation Cardiovascular status: blood pressure returned to baseline and stable Postop Assessment: no headache, no backache, no apparent nausea or vomiting, able to ambulate, adequate PO intake and patient able to bend at knees Anesthetic complications: no   No notable events documented.  Last Vitals:  Vitals:   08/01/24 0224 08/01/24 0642  BP: 103/65 95/72  Pulse: 63 74  Resp: 20 20  Temp:  (!) 36.4 C  SpO2: 96% 98%    Last Pain:  Vitals:   08/01/24 0642  TempSrc: Axillary  PainSc:    Pain Goal:                   Dilpreet Faires
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
## 2024-08-01 NOTE — Lactation Note (Addendum)
 This note was copied from a baby's chart. Lactation Consultation Note  Patient Name: Heather Ingram Unijb'd Date: 08/01/2024 Age:24 hours Reason for consult: Follow-up assessment;RN request  P2, 37 wks, @ 18 hrs of life. Mom receptive to putting infant to breast with LC arrival. Mom using football on right breast, offered a few adjustments, moving baby to be StS with mom breast. Encouraged hand expression to start, breast compression to move more milk to baby. Switched baby hand and breast hand, so mom could control baby body better. Baby quite mobile and all over while breast feeding. Discussed steps of latching. Praised mom for increased experience, and as second baby- breasts respond faster/easier- mom should notice better milk supply/ easier time. Per mom - last baby didn't latch well. Encouraged cluster feeding overnights very normal, increases supply. Mom has used all her antenatal collected colostrum- Talked to RN- milk bank not open for donor milk- encouraged mom to hand express after feeds to collect and feed back her own colostrum after breast feeding. Praised couplet doing well and to call anytime for assist. Hand pump and coconut oil provided to mom by previous shift.    Maternal Data Has patient been taught Hand Expression?: Yes Does the patient have breastfeeding experience prior to this delivery?: Yes How long did the patient breastfeed?: 6 months of exclusive pumping and offering formula  Feeding Mother's Current Feeding Choice: Breast Milk  LATCH Score Latch: Grasps breast easily, tongue down, lips flanged, rhythmical sucking.  Audible Swallowing: Spontaneous and intermittent  Type of Nipple: Everted at rest and after stimulation  Comfort (Breast/Nipple): Soft / non-tender  Hold (Positioning): Assistance needed to correctly position infant at breast and maintain latch.  LATCH Score: 9   Lactation Tools Discussed/Used Pump Education: Milk  Storage  Interventions Interventions: Assisted with latch;Breast feeding basics reviewed;Hand express;Breast compression;Expressed milk;Coconut oil;Hand pump;Education;LC Services brochure;CDC milk storage guidelines  Discharge Pump: DEBP;Manual  Consult Status Consult Status: Follow-up Date: 08/02/24 Follow-up type: In-patient    Mamta Rimmer 08/01/2024, 8:32 AM

## 2024-08-01 NOTE — Progress Notes (Signed)
 Subjective: Postpartum Day # 1 : S/P NSVD due to pt was admitted on 10/10 for IOL at 37.3 for DFM, Obesity BMI 61, GBS+ TX 8 doses PCN, progressed with foley, cytotec , arom and pitocin  to SVD on 10/11 @ 1342 over intact perineum, ebl was , hgb drop of 11.9-10, asymptomatics, no tx indicated at this time. Had a 83 Female desires in pt circ prior to discharge tomorrow. . Patient up ad lib, denies syncope or dizziness. Reports consuming regular diet without issues and denies N/V. Patient reports 0 bowel movement + passing flatus.  Denies issues with urination and reports bleeding is lighter.  Patient is BRfeeding and reports going well.  Desires interval tubal and possible depot before discharge tomorrow.  for postpartum contraception.  Pain is being appropriately managed with use of po meds.   no laceration Feeding:  br Contraceptive plan:  interval tubal with possible depot prior to discharge tomorrow.  BB: Circ desires in pt   Objective: Vital signs in last 24 hours: Patient Vitals for the past 24 hrs:  BP Temp Temp src Pulse Resp SpO2  08/01/24 0642 95/72 (!) 97.5 F (36.4 C) Axillary 74 20 98 %  08/01/24 0224 103/65 -- -- 63 20 96 %  07/31/24 2105 (!) 99/55 97.6 F (36.4 C) Oral 72 20 97 %  07/31/24 1715 (!) 115/57 99.4 F (37.4 C) Oral 84 17 96 %  07/31/24 1604 124/86 97.8 F (36.6 C) Oral 85 18 98 %  07/31/24 1510 111/64 -- -- 72 -- --  07/31/24 1430 (!) 120/54 -- -- 79 -- --  07/31/24 1400 129/67 -- -- 94 -- --  07/31/24 1300 122/79 -- -- 75 -- --  07/31/24 1230 114/68 -- -- 72 -- --  07/31/24 1200 117/71 -- -- 81 -- --  07/31/24 1130 114/73 -- -- 67 -- --  07/31/24 1100 (!) 104/57 -- -- 76 -- --  07/31/24 1030 (!) 101/49 -- -- 69 -- --  07/31/24 1000 117/63 97.9 F (36.6 C) Axillary 93 -- --  07/31/24 0930 122/74 -- -- 85 -- --  07/31/24 0900 116/82 -- -- 79 -- 99 %  07/31/24 0830 113/71 -- -- 69 -- 98 %  07/31/24 0820 106/74 -- -- 83 -- 98 %  07/31/24 0815  120/61 -- -- 76 -- 98 %  07/31/24 0810 115/74 -- -- 72 -- 98 %     Physical Exam:  General: alert, cooperative, appears stated age, and morbidly obese Mood/Affect: happy Lungs: clear to auscultation, no wheezes, rales or rhonchi, symmetric air entry.  Heart: normal rate, regular rhythm, normal S1, S2, no murmurs, rubs, clicks or gallops. Breast: breasts appear normal, no suspicious masses, no skin or nipple changes or axillary nodes. Abdomen:  + bowel sounds, soft, non-tender GU: perineum intact, healing well. No signs of external hematomas.  Uterine Fundus: firm Lochia: appropriate Skin: Warm, Dry. DVT Evaluation: No evidence of DVT seen on physical exam. Negative Homan's sign. No cords or calf tenderness. No significant calf/ankle edema.     Latest Ref Rng & Units 08/01/2024    5:11 AM 07/30/2024    7:14 AM 07/11/2024   12:30 AM  CBC  WBC 4.0 - 10.5 K/uL 8.5  8.8  9.1   Hemoglobin 12.0 - 15.0 g/dL 89.9  88.0  88.6   Hematocrit 36.0 - 46.0 % 30.2  36.0  34.3   Platelets 150 - 400 K/uL 231  280  288     Results  for orders placed or performed during the hospital encounter of 07/30/24 (from the past 24 hours)  CBC     Status: Abnormal   Collection Time: 08/01/24  5:11 AM  Result Value Ref Range   WBC 8.5 4.0 - 10.5 K/uL   RBC 3.95 3.87 - 5.11 MIL/uL   Hemoglobin 10.0 (L) 12.0 - 15.0 g/dL   HCT 69.7 (L) 63.9 - 53.9 %   MCV 76.5 (L) 80.0 - 100.0 fL   MCH 25.3 (L) 26.0 - 34.0 pg   MCHC 33.1 30.0 - 36.0 g/dL   RDW 86.3 88.4 - 84.4 %   Platelets 231 150 - 400 K/uL   nRBC 0.0 0.0 - 0.2 %     CBG (last 3)  No results for input(s): GLUCAP in the last 72 hours.   I/O last 3 completed shifts: In: -  Out: 639 [Urine:400; Blood:239]   Assessment Postpartum Day # 1 : S/P NSVD due to pt was admitted on 10/10 for IOL at 37.3 for DFM, Obesity BMI 61, GBS+ TX 8 doses PCN, progressed with foley, cytotec , arom and pitocin  to SVD on 10/11 @ 1342 over intact perineum, ebl was  , hgb drop of 11.9-10, asymptomatics, no tx indicated at this time. Had a 70 Female desires in pt circ prior to discharge tomorrow. Pt stable. -1 involution. BRfeeding. Hemodynamically stable.   Plan: Continue other mgmt as ordered VTE prophylactics: Early ambulated as tolerates.  Pain control: Motrin /Tylenol  PRN Education given regarding options for contraception, including injectable contraception.  Plan for discharge tomorrow, Breastfeeding, Lactation consult, and Circumcision prior to discharge  Dr. Rosalva to be updated on patient status  Jensen Cheramie  CNM, FNP-C, PMHNP-BC  [id number]  [place], [place] [address]  Cell: [phone]  Office Phone: [phone] Fax: [phone] 08/01/2024  8:08 AM

## 2024-08-02 ENCOUNTER — Other Ambulatory Visit (HOSPITAL_COMMUNITY): Payer: Self-pay

## 2024-08-02 MED ORDER — IBUPROFEN 600 MG PO TABS: 600.0000 mg | ORAL_TABLET | Freq: Four times a day (QID) | ORAL | 0 refills | Status: DC | PRN

## 2024-08-02 MED ORDER — IBUPROFEN 600 MG PO TABS
600.0000 mg | ORAL_TABLET | Freq: Four times a day (QID) | ORAL | 0 refills | Status: AC | PRN
  Filled 2024-08-02: qty 40, 10d supply, fill #0

## 2024-08-02 NOTE — Plan of Care (Signed)
 Problem: Education: Goal: Knowledge of General Education information will improve Description: Including pain rating scale, medication(s)/side effects and non-pharmacologic comfort measures 08/02/2024 1127 by Madison Rosina LABOR, LPN Outcome: Adequate for Discharge 08/02/2024 0938 by Madison Rosina LABOR, LPN Outcome: Progressing   Problem: Health Behavior/Discharge Planning: Goal: Ability to manage health-related needs will improve 08/02/2024 1127 by Madison Rosina LABOR, LPN Outcome: Adequate for Discharge 08/02/2024 0938 by Madison Rosina LABOR, LPN Outcome: Progressing   Problem: Clinical Measurements: Goal: Ability to maintain clinical measurements within normal limits will improve 08/02/2024 1127 by Madison Rosina LABOR, LPN Outcome: Adequate for Discharge 08/02/2024 0938 by Madison Rosina LABOR, LPN Outcome: Progressing Goal: Will remain free from infection 08/02/2024 1127 by Madison Rosina LABOR, LPN Outcome: Adequate for Discharge 08/02/2024 0938 by Madison Rosina LABOR, LPN Outcome: Progressing Goal: Diagnostic test results will improve 08/02/2024 1127 by Madison Rosina LABOR, LPN Outcome: Adequate for Discharge 08/02/2024 0938 by Madison Rosina LABOR, LPN Outcome: Progressing Goal: Respiratory complications will improve 08/02/2024 1127 by Madison Rosina LABOR, LPN Outcome: Adequate for Discharge 08/02/2024 0938 by Madison Rosina LABOR, LPN Outcome: Progressing Goal: Cardiovascular complication will be avoided 08/02/2024 1127 by Madison Rosina LABOR, LPN Outcome: Adequate for Discharge 08/02/2024 9061 by Madison Rosina LABOR, LPN Outcome: Progressing   Problem: Activity: Goal: Risk for activity intolerance will decrease 08/02/2024 1127 by Madison Rosina LABOR, LPN Outcome: Adequate for Discharge 08/02/2024 9061 by Madison Rosina LABOR, LPN Outcome: Progressing   Problem: Nutrition: Goal: Adequate nutrition will be maintained 08/02/2024 1127 by Madison Rosina LABOR, LPN Outcome: Adequate for Discharge 08/02/2024 0938 by Madison Rosina LABOR, LPN Outcome:  Progressing   Problem: Coping: Goal: Level of anxiety will decrease 08/02/2024 1127 by Madison Rosina LABOR, LPN Outcome: Adequate for Discharge 08/02/2024 0938 by Madison Rosina LABOR, LPN Outcome: Progressing   Problem: Elimination: Goal: Will not experience complications related to bowel motility 08/02/2024 1127 by Madison Rosina LABOR, LPN Outcome: Adequate for Discharge 08/02/2024 0938 by Madison Rosina LABOR, LPN Outcome: Progressing Goal: Will not experience complications related to urinary retention 08/02/2024 1127 by Madison Rosina LABOR, LPN Outcome: Adequate for Discharge 08/02/2024 0938 by Madison Rosina LABOR, LPN Outcome: Progressing   Problem: Pain Managment: Goal: General experience of comfort will improve and/or be controlled 08/02/2024 1127 by Madison Rosina LABOR, LPN Outcome: Adequate for Discharge 08/02/2024 0938 by Madison Rosina LABOR, LPN Outcome: Progressing   Problem: Safety: Goal: Ability to remain free from injury will improve 08/02/2024 1127 by Madison Rosina LABOR, LPN Outcome: Adequate for Discharge 08/02/2024 0938 by Madison Rosina LABOR, LPN Outcome: Progressing   Problem: Skin Integrity: Goal: Risk for impaired skin integrity will decrease 08/02/2024 1127 by Madison Rosina LABOR, LPN Outcome: Adequate for Discharge 08/02/2024 0938 by Madison Rosina LABOR, LPN Outcome: Progressing   Problem: Education: Goal: Knowledge of condition will improve 08/02/2024 1127 by Madison Rosina LABOR, LPN Outcome: Adequate for Discharge 08/02/2024 0938 by Madison Rosina LABOR, LPN Outcome: Progressing Goal: Individualized Educational Video(s) 08/02/2024 1127 by Madison Rosina LABOR, LPN Outcome: Adequate for Discharge 08/02/2024 0938 by Madison Rosina LABOR, LPN Outcome: Progressing Goal: Individualized Newborn Educational Video(s) 08/02/2024 1127 by Madison Rosina LABOR, LPN Outcome: Adequate for Discharge 08/02/2024 0938 by Madison Rosina LABOR, LPN Outcome: Progressing   Problem: Activity: Goal: Will verbalize the importance of balancing  activity with adequate rest periods 08/02/2024 1127 by Madison Rosina LABOR, LPN Outcome: Adequate for Discharge 08/02/2024 9061 by Madison Rosina LABOR, LPN Outcome: Progressing Goal: Ability to tolerate increased activity will improve 08/02/2024 1127 by Madison Rosina LABOR, LPN  Outcome: Adequate for Discharge 08/02/2024 9061 by Madison Rosina LABOR, LPN Outcome: Progressing   Problem: Coping: Goal: Ability to identify and utilize available resources and services will improve 08/02/2024 1127 by Madison Rosina LABOR, LPN Outcome: Adequate for Discharge 08/02/2024 9061 by Madison Rosina LABOR, LPN Outcome: Progressing   Problem: Life Cycle: Goal: Chance of risk for complications during the postpartum period will decrease 08/02/2024 1127 by Madison Rosina LABOR, LPN Outcome: Adequate for Discharge 08/02/2024 9061 by Madison Rosina LABOR, LPN Outcome: Progressing   Problem: Role Relationship: Goal: Ability to demonstrate positive interaction with newborn will improve 08/02/2024 1127 by Madison Rosina LABOR, LPN Outcome: Adequate for Discharge 08/02/2024 9061 by Madison Rosina LABOR, LPN Outcome: Progressing   Problem: Skin Integrity: Goal: Demonstration of wound healing without infection will improve 08/02/2024 1127 by Madison Rosina LABOR, LPN Outcome: Adequate for Discharge 08/02/2024 9061 by Madison Rosina LABOR, LPN Outcome: Progressing

## 2024-08-02 NOTE — Plan of Care (Signed)

## 2024-08-02 NOTE — Lactation Note (Signed)
 This note was copied from a baby's chart. Lactation Consultation Note  Patient Name: Rubyann Lingle Unijb'd Date: 08/02/2024 Age:24 hours Reason for consult: Follow-up assessment;Maternal discharge;Early term 37-38.6wks.  P2, change in weight -2.62 to -4.15% MOB is breast and formula feeding, MOB exclusively pump and formula feed her 1st child for 6 months. MOB goal is to breastfeed infant. LC entered the room, MOB informed LC infant recently formula feed and was given 47 mls of 20 kcal formula infant was still cuing to feed. MOB was open for latch assistance, only been using the football hold position and felt infant been having a shallow latch. MOB will pillow support latch infant on her right breast using the cross cradle hold, Per MOB , she likes this position. Infant actively breastfeed for 7 minutes. LC changed MOB flange size she not been been pumping due to pain when pumping, LC fitted MOB with 21 mm breast flange which she felt was more comfortable, MOB was still using the DEBP as LC left the room and was expressing colostrum in both flanges.   Current feeding goals: Day 2 of life 1- MOB will latch infant first every feeding to help stimulate and establish her milk supply, breastfeed infant by cues, on demand, 8-12 times within 24 hours, skin to skin. 2- After latching infant at the breast, MOB will offer infant any EBM first that was pumped before supplementing infant with fomula. MOB has handout  Feeding Guidelines. 3- MOB will continue to pump at home every 3 hours and if infant does not latch she will pump to help establish her milk supply.  LC discharge education: 1- LC discussed engorgement treatment and prevention. 2- LC discussed warning signs of dehydration in infant. 3- LC discussed continue community resources and support: LC hotline, LC breastfeeding support group and LC outpatient clinic.  Maternal Data    Feeding Mother's Current Feeding Choice: Breast  Milk and Formula  LATCH Score Latch: Grasps breast easily, tongue down, lips flanged, rhythmical sucking.  Audible Swallowing: Spontaneous and intermittent  Type of Nipple: Everted at rest and after stimulation  Comfort (Breast/Nipple): Soft / non-tender         Lactation Tools Discussed/Used Tools: Flanges Flange Size: 21 Breast pump type: Double-Electric Breast Pump Pump Education: Setup, frequency, and cleaning;Milk Storage Reason for Pumping: MOB request and hx exclussively pumping and formula feeding with her first child. Pumping frequency: MOB plans to pump when discharged will pump every 3 hours for 15 minutes as she continues to work on infant's latch at the breast. Pumped volume: 2 mL  Interventions Interventions: Assisted with latch;Skin to skin;Breast compression;Adjust position;Support pillows;Position options;Hand pump;DEBP;Education;LC Services brochure;CDC milk storage guidelines;CDC Guidelines for Breast Pump Cleaning  Discharge Discharge Education: Engorgement and breast care;Warning signs for feeding baby;Outpatient recommendation Pump: Personal;DEBP;Manual (Per MOB, she has various DEBP's at home she has Spectra  S2, Medella DEBP and MOM Cozy.)  Consult Status Consult Status: Complete Date: 08/02/24 Follow-up type: Physician    Grayce LULLA Batter 08/02/2024, 11:39 AM

## 2024-08-02 NOTE — Discharge Instructions (Signed)

## 2024-08-02 NOTE — Discharge Summary (Signed)
 Postpartum Discharge Summary  Date of Service updated 08-02-24     Patient Name: Heather Ingram DOB: 2001/09/28 MRN: [id number]  Date of admission: 07/30/2024 Delivery date:07/31/2024 Delivering provider: ROSALVA SAWYER Date of discharge: 08/02/2024  Admitting diagnosis: Morbid obesity with body mass index (BMI) of 40.0 or higher (HCC) [E66.01] Intrauterine pregnancy: [redacted]w[redacted]d     Secondary diagnosis:  Principal Problem:   Morbid obesity with body mass index (BMI) of 40.0 or higher (HCC) Active Problems:   Encounter for induction of labor   SVD (spontaneous vaginal delivery)   Normal postpartum course  Additional problems: none    Discharge diagnosis: Term Pregnancy Delivered                                              Post partum procedures:none Augmentation: Pitocin , Cytotec , and cervical balloon and AROM Complications: None  Hospital course: Induction of Labor With Vaginal Delivery   23 y.o. yo [id number] at [redacted]w[redacted]d was admitted to the hospital 07/30/2024 for induction of labor.  Indication for induction: persistent decreased fetal movement.  Patient had an labor course complicated by nothing Membrane Rupture Time/Date: 6:32 AM,07/31/2024  Delivery Method:Vaginal, Spontaneous Operative Delivery:N/A Episiotomy: None Lacerations:  None Details of delivery can be found in separate delivery note.  Patient had a postpartum course complicated by nothing. Patient is discharged home 08/02/24.  Newborn Data: Birth date:07/31/2024 Birth time:1:42 PM Gender:Female Living status:Living Apgars:9 ,9  Weight:3260 g  Magnesium Sulfate received: No BMZ received: No  Immunizations administered: There is no immunization history for the selected administration types on file for this patient.  Physical exam  Vitals:   08/01/24 0642 08/01/24 1500 08/01/24 2059 08/02/24 0516  BP: 95/72 128/87 115/80 127/80  Pulse: 74 70 77 92  Resp: 20 18 18    Temp: (!) 97.5 F (36.4 C) 98.1  F (36.7 C) 97.9 F (36.6 C) 98.2 F (36.8 C)  TempSrc: Axillary Oral Oral Oral  SpO2: 98% 100%    Weight:      Height:       General: alert, cooperative, and no distress Lochia: appropriate Uterine Fundus: FF, NT Incision: N/A DVT Evaluation: No evidence of DVT seen on physical exam. Negative Homan's sign. Trace edema  Labs: Lab Results  Component Value Date   WBC 8.5 08/01/2024   HGB 10.0 (L) 08/01/2024   HCT 30.2 (L) 08/01/2024   MCV 76.5 (L) 08/01/2024   PLT 231 08/01/2024      Latest Ref Rng & Units 07/11/2024   12:30 AM  CMP  Glucose 70 - 99 mg/dL 885   BUN 6 - 20 mg/dL 7   Creatinine 9.55 - 8.99 mg/dL 9.37   Sodium 864 - 854 mmol/L 136   Potassium 3.5 - 5.1 mmol/L 4.1   Chloride 98 - 111 mmol/L 108   CO2 22 - 32 mmol/L 18   Calcium 8.9 - 10.3 mg/dL 8.8   Total Protein 6.5 - 8.1 g/dL 6.7   Total Bilirubin 0.0 - 1.2 mg/dL 0.3   Alkaline Phos 38 - 126 U/L 137   AST 15 - 41 U/L 22   ALT 0 - 44 U/L 22    Edinburgh Score:    08/02/2024    7:35 AM  Edinburgh Postnatal Depression Scale Screening Tool  I have been able to laugh and see the funny side of  things. 0  I have looked forward with enjoyment to things. 0  I have blamed myself unnecessarily when things went wrong. 1  I have been anxious or worried for no good reason. 2  I have felt scared or panicky for no good reason. 1  Things have been getting on top of me. 0  I have been so unhappy that I have had difficulty sleeping. 0  I have felt sad or miserable. 1  I have been so unhappy that I have been crying. 1  The thought of harming myself has occurred to me. 0  Edinburgh Postnatal Depression Scale Total 6      After visit meds:  Allergies as of 08/02/2024   No Known Allergies      Medication List     STOP taking these medications    aspirin  EC 81 MG tablet       TAKE these medications    ibuprofen  600 MG tablet Commonly known as: ADVIL  Take 1 tablet (600 mg total) by mouth every 6  (six) hours as needed.   prenatal multivitamin Tabs tablet Take 1 tablet by mouth daily at 12 noon.   Vitamin D3 50 MCG (2000 UT) capsule Take 4,000 Units by mouth daily.         Discharge home in stable condition Infant Feeding: Breast Infant Disposition:home with mother Discharge instruction: per After Visit Summary and Postpartum booklet. Activity: Advance as tolerated. Pelvic rest for 6 weeks.  Diet: routine diet Anticipated Birth Control: Plans Interval BTL Postpartum Appointment:6 weeks Additional Postpartum F/U: n/a Future Appointments:No future appointments. Follow up Visit:  Follow-up Information     [facility] Follow up in 6 week(s).   Specialty: Obstetrics and Gynecology Why: For Postpartum follow-up Contact information: [address] [place]  [address] [phone]                    08/02/2024 Jon CINDERELLA Rummer, MD

## 2024-08-04 ENCOUNTER — Ambulatory Visit

## 2024-08-04 ENCOUNTER — Other Ambulatory Visit

## 2024-08-09 ENCOUNTER — Telehealth (HOSPITAL_COMMUNITY): Payer: Self-pay | Admitting: *Deleted

## 2024-08-09 NOTE — Telephone Encounter (Signed)
 Attempted hospital discharge follow-up call. Left message for patient to return RN call with any questions or concerns. Allean IVAR Carton, RN, 08/09/24, [phone]

## 2024-08-11 ENCOUNTER — Inpatient Hospital Stay (HOSPITAL_COMMUNITY)
